# Patient Record
Sex: Male | Born: 1935 | Race: White | Hispanic: No | Marital: Married | Smoking: Former smoker
Health system: Southern US, Community
[De-identification: ages and names within clinical notes are randomized; demographics above are authoritative.]

## PROBLEM LIST (undated history)

## (undated) DIAGNOSIS — I2510 Atherosclerotic heart disease of native coronary artery without angina pectoris: Secondary | ICD-10-CM

## (undated) DIAGNOSIS — I739 Peripheral vascular disease, unspecified: Secondary | ICD-10-CM

## (undated) DIAGNOSIS — I779 Disorder of arteries and arterioles, unspecified: Secondary | ICD-10-CM

## (undated) DIAGNOSIS — I255 Ischemic cardiomyopathy: Secondary | ICD-10-CM

## (undated) DIAGNOSIS — I10 Essential (primary) hypertension: Secondary | ICD-10-CM

## (undated) DIAGNOSIS — I219 Acute myocardial infarction, unspecified: Secondary | ICD-10-CM

## (undated) DIAGNOSIS — E782 Mixed hyperlipidemia: Secondary | ICD-10-CM

## (undated) DIAGNOSIS — C801 Malignant (primary) neoplasm, unspecified: Secondary | ICD-10-CM

## (undated) HISTORY — DX: Mixed hyperlipidemia: E78.2

## (undated) HISTORY — PX: FEMORAL HERNIA REPAIR: SHX632

## (undated) HISTORY — DX: Atherosclerotic heart disease of native coronary artery without angina pectoris: I25.10

## (undated) HISTORY — DX: Acute myocardial infarction, unspecified: I21.9

## (undated) HISTORY — DX: Ischemic cardiomyopathy: I25.5

## (undated) HISTORY — DX: Peripheral vascular disease, unspecified: I73.9

## (undated) HISTORY — DX: Essential (primary) hypertension: I10

## (undated) HISTORY — DX: Disorder of arteries and arterioles, unspecified: I77.9

---

## 2003-07-29 HISTORY — PX: CORONARY ARTERY BYPASS GRAFT: SHX141

## 2003-07-30 ENCOUNTER — Inpatient Hospital Stay (HOSPITAL_COMMUNITY): Admission: AD | Admit: 2003-07-30 | Discharge: 2003-08-06 | Payer: Self-pay | Admitting: *Deleted

## 2003-12-08 ENCOUNTER — Encounter
Admission: RE | Admit: 2003-12-08 | Discharge: 2003-12-08 | Payer: Self-pay | Admitting: Thoracic Surgery (Cardiothoracic Vascular Surgery)

## 2004-04-29 ENCOUNTER — Ambulatory Visit: Payer: Self-pay | Admitting: Cardiology

## 2005-04-18 ENCOUNTER — Ambulatory Visit: Payer: Self-pay | Admitting: Cardiology

## 2005-12-02 ENCOUNTER — Ambulatory Visit: Payer: Self-pay | Admitting: Cardiology

## 2005-12-09 ENCOUNTER — Ambulatory Visit: Payer: Self-pay | Admitting: Cardiology

## 2005-12-15 ENCOUNTER — Ambulatory Visit: Payer: Self-pay | Admitting: Cardiology

## 2005-12-30 ENCOUNTER — Ambulatory Visit: Payer: Self-pay | Admitting: Cardiology

## 2006-08-07 ENCOUNTER — Ambulatory Visit: Payer: Self-pay | Admitting: Cardiology

## 2006-08-16 ENCOUNTER — Ambulatory Visit: Payer: Self-pay | Admitting: Cardiology

## 2007-04-17 ENCOUNTER — Encounter: Payer: Self-pay | Admitting: Cardiology

## 2007-04-17 ENCOUNTER — Ambulatory Visit: Payer: Self-pay | Admitting: Cardiology

## 2007-10-19 ENCOUNTER — Ambulatory Visit: Payer: Self-pay | Admitting: Cardiology

## 2007-10-24 ENCOUNTER — Encounter: Payer: Self-pay | Admitting: Cardiology

## 2008-09-16 ENCOUNTER — Encounter: Payer: Self-pay | Admitting: Cardiology

## 2008-10-16 ENCOUNTER — Encounter: Payer: Self-pay | Admitting: Cardiology

## 2008-12-11 ENCOUNTER — Ambulatory Visit: Payer: Self-pay | Admitting: Cardiology

## 2008-12-12 ENCOUNTER — Encounter: Payer: Self-pay | Admitting: Cardiology

## 2008-12-19 ENCOUNTER — Encounter: Payer: Self-pay | Admitting: Cardiology

## 2009-02-13 ENCOUNTER — Encounter: Payer: Self-pay | Admitting: Cardiology

## 2009-02-17 ENCOUNTER — Encounter: Payer: Self-pay | Admitting: Cardiology

## 2009-11-23 ENCOUNTER — Telehealth (INDEPENDENT_AMBULATORY_CARE_PROVIDER_SITE_OTHER): Payer: Self-pay | Admitting: *Deleted

## 2009-12-10 ENCOUNTER — Ambulatory Visit: Payer: Self-pay | Admitting: Cardiology

## 2009-12-10 DIAGNOSIS — I2510 Atherosclerotic heart disease of native coronary artery without angina pectoris: Secondary | ICD-10-CM

## 2009-12-10 DIAGNOSIS — E782 Mixed hyperlipidemia: Secondary | ICD-10-CM | POA: Insufficient documentation

## 2009-12-10 DIAGNOSIS — I6529 Occlusion and stenosis of unspecified carotid artery: Secondary | ICD-10-CM | POA: Insufficient documentation

## 2009-12-10 DIAGNOSIS — I10 Essential (primary) hypertension: Secondary | ICD-10-CM

## 2010-02-23 ENCOUNTER — Telehealth (INDEPENDENT_AMBULATORY_CARE_PROVIDER_SITE_OTHER): Payer: Self-pay | Admitting: *Deleted

## 2010-06-20 ENCOUNTER — Encounter: Payer: Self-pay | Admitting: Thoracic Surgery (Cardiothoracic Vascular Surgery)

## 2010-06-29 NOTE — Progress Notes (Signed)
Summary: RX REFILL METOPROLOL SUCCINATE  Phone Note Call from Patient Call back at Home Phone [phone]   Caller: patient walk in Reason for Call: Refill Medication Summary of Call: Metoprolol Succ   25 mg 1 tab daily [facility] Initial call taken by: Claudette Laws,  November 23, 2009 10:06 AM    Prescriptions: METOPROLOL SUCCINATE 25 MG XR24H-TAB (METOPROLOL SUCCINATE) Take 1 tablet by mouth once  a day  #90 x 0   Entered by:   Carlye Grippe   Authorized by:   Loreli Slot, MD, [facility]   Signed by:   Carlye Grippe on 11/23/2009   Method used:   Electronically to        [facility]* (retail)             ,          Ph: [phone]       Fax: [phone]   RxID:   [id number]

## 2010-06-29 NOTE — Progress Notes (Signed)
Summary: REFILL REQUEST/RX REFILL TOPROL XL  Phone Note Call from Patient Call back at Home Phone [phone]   Caller: PATIENT WALK IN Reason for Call: Talk to Nurse Details for Reason: refill medication Summary of Call: metoprolo succ(toprol-er   25 MG TAKE 1 TABLET DAILY  [facility] Initial call taken by: Claudette Laws,  February 23, 2010 10:11 AM    Prescriptions: METOPROLOL SUCCINATE 25 MG XR24H-TAB (METOPROLOL SUCCINATE) Take 1 tablet by mouth once  a day  #90 x 3   Entered by:   Carlye Grippe   Authorized by:   Loreli Slot, MD, [facility]   Signed by:   Carlye Grippe on 02/23/2010   Method used:   Electronically to        [facility]* (retail)             ,          Ph: [phone]       Fax: [phone]   RxID:   [id number]

## 2010-06-29 NOTE — Assessment & Plan Note (Signed)
Summary: 1 yr check -rev reminder vs   Visit Type:  Follow-up Primary Provider:  Dr. Doreen Beam   History of Present Illness: 77 year old male presents for followup. He was last seen in July 2010. He continues to do quite well. He denies any problems with angina or limiting shortness of breath. He continues to stay active doing outdoor chores in the mornings. She reports compliance with his medications which are outlined below.  Labs from last July showed AST 23, ALT 17, cholesterol 98, HDL 28, LDL 49, triglycerides 106. We discussed these today.  I reviewed his history and prior cardiac testing. He has previously documented mild carotid atherosclerosis. He prefers observation only at this time.  Preventive Screening-Counseling & Management  Alcohol-Tobacco     Smoking Status: quit     Year Quit: 1980  Current Medications (verified): 1)  Altace 10 Mg Caps (Ramipril) .Marland Kitchen.. 1 Capsule By Mouth Once A Day 2)  Hydrochlorothiazide 25 Mg Tabs (Hydrochlorothiazide) .... Take One Half Tablet By Mouth Once A Day 3)  Metoprolol Succinate 25 Mg Xr24h-Tab (Metoprolol Succinate) .... Take 1 Tablet By Mouth Once  A Day 4)  Simvastatin 10 Mg Tabs (Simvastatin) .... Take 1 /2 Tablet By Mouth Every Night. 5)  Centrum Silver Ultra Mens  Tabs (Multiple Vitamins-Minerals) .... Take 1 Tablet By Mouth Once A Day 6)  Aspirin Ec 325 Mg Tbec (Aspirin) .... Take One Half Tablet By Mouth Daily  Allergies (verified): No Known Drug Allergies  Comments:  Nurse/Medical Assistant: The patient's medication list and allergies were reviewed with the patient and were updated in the Medication and Allergy Lists.  Past History:  Social History: Last updated: 12/10/2009 Married  Tobacco Use - No Alcohol Use - no Drug Use - no  Past Medical History: CAD - multivessel Hyperlipidemia Hypertension PVD Myocardial Infarction - remote NSTEMI Carotid artery disease - mild  Past Surgical  History: Herniorrhaphy CABG 3/05, Dr. Cornelius Moras, LIMA to LAD, SVG to diagonal and OM, SVG to PDA  Family History: Family History of Coronary Artery Disease  Social History: Married  Tobacco Use - No Alcohol Use - no Drug Use - no Smoking Status:  quit  Review of Systems  The patient denies anorexia, fever, chest pain, syncope, dyspnea on exertion, peripheral edema, melena, and hematochezia.         Otherwise reviewed and negative.  Vital Signs:  Patient profile:   77 year old male Height:      69 inches Weight:      176 pounds BMI:     26.08 Pulse rate:   60 / minute BP sitting:   136 / 90  (left arm) Cuff size:   regular  Vitals Entered By: Carlye Grippe (December 10, 2009 2:13 PM)  Physical Exam  Additional Exam:  Normally nourished elderly male in no acute distress. HEENT: Conjunctiva and lids normal, oropharynx with moist mucosa. Neck: Supple, no elevated JVP or bruits. Lungs: Clear to auscultation, no murmur. Cardiac: Regular rate and rhythm, soft systolic murmur at the base, preserved second heart sound, no S3 Abdomen: Soft, nontender, bowel sounds present. Extremities: No pitting edema.   Nuclear Study  Procedure date:  08/16/2006  Findings:      Adenosine Cardiolite without diagnostic ST segment changes. Anteroseptal and basal inferior fixed defects, no ischemia, LVEF 60%.  EKG  Procedure date:  12/10/2009  Findings:      Sinus bradycardia at 58 beats per minute with prolonged PR interval of 256 ms and occasional  PAC, left axis, nonspecific ST changes.  Impression & Recommendations:  Problem # 1:  CORONARY ATHEROSCLEROSIS NATIVE CORONARY ARTERY (ICD-414.01)  Symptomatically stable on medical therapy. No refills needed today. Mr. Huesman remains pleased with his functional capacity, and in light of no new symptomatology, we will hold off on further ischemic evaluation at this time. Most recent Cardiolite is noted above. Continue annual followup.  His  updated medication list for this problem includes:    Altace 10 Mg Caps (Ramipril) .Marland Kitchen... 1 capsule by mouth once a day    Metoprolol Succinate 25 Mg Xr24h-tab (Metoprolol succinate) .Marland Kitchen... Take 1 tablet by mouth once  a day    Aspirin Ec 325 Mg Tbec (Aspirin) .Marland Kitchen... Take one half tablet by mouth daily  Problem # 2:  ESSENTIAL HYPERTENSION, BENIGN (ICD-401.1)  Continue medical therapy. Followed by Dr. Sherril Croon.  His updated medication list for this problem includes:    Altace 10 Mg Caps (Ramipril) .Marland Kitchen... 1 capsule by mouth once a day    Hydrochlorothiazide 25 Mg Tabs (Hydrochlorothiazide) .Marland Kitchen... Take one half tablet by mouth once a day    Metoprolol Succinate 25 Mg Xr24h-tab (Metoprolol succinate) .Marland Kitchen... Take 1 tablet by mouth once  a day    Aspirin Ec 325 Mg Tbec (Aspirin) .Marland Kitchen... Take one half tablet by mouth daily  Problem # 3:  MIXED HYPERLIPIDEMIA (ICD-272.2)  Continuing to tolerate low-dose simvastatin. Lipids are aggressively controlled.  His updated medication list for this problem includes:    Simvastatin 10 Mg Tabs (Simvastatin) .Marland Kitchen... Take 1 /2 tablet by mouth every night.  Problem # 4:  CAROTID ARTERY OCCLUSION (ICD-433.10)  Mild based on prior evaluation. Patient prefers observation only at this time.  His updated medication list for this problem includes:    Aspirin Ec 325 Mg Tbec (Aspirin) .Marland Kitchen... Take one half tablet by mouth daily  Other Orders: EKG w/ Interpretation (93000)  Patient Instructions: 1)  Your physician wants you to follow-up in: 1 year. You will receive a reminder letter in the mail one-two months in advance. If you don't receive a letter, please call our office to schedule the follow-up appointment. 2)  Your physician recommends that you go to the [facility] for a FASTING lipid profile and liver function labs:  BEFORE OFFICE VISIT IN 1 YEAR.

## 2010-10-12 NOTE — Assessment & Plan Note (Signed)
[facility]                          [facility] OFFICE NOTE   NAME:[name], [name]                     MRN:          [id number]  DATE:04/17/2007                            DOB:          June 15, 1917    CARDIOLOGIST:  Dr. Simona Huh.   PRIMARY CARE PHYSICIAN:  Dr. Doreen Beam.   REASON FOR VISIT:  Six-month followup.   HISTORY OF PRESENT ILLNESS:  [name] is a very pleasant 77 year old  male patient, a World War II veteran who fought with the [employer] in [place], who presents to the office today for followup.  Overall, [name] is doing well.  [name] denies any chest pain or shortness of  breath.  [name] denies any exertional heaviness or tightness.  [name] denies any  significant exertional dyspnea.  [name] denies any orthopnea, PND or pedal  edema.  [name] denies any syncope or near-syncope.  [name] denies any  lightheadedness or dizziness.   CURRENT MEDICATIONS:  1. Aspirin 325 mg half a tablet daily.  2. Toprol-XL 25 mg daily.  3. Simvastatin 10 mg nightly.  4. Altace 10 mg daily.  5. Hydrochlorothiazide 25 mg half a tablet daily.   PHYSICAL EXAMINATION:  [name] is a well-nourished, well-developed male.  Blood pressure is 136/82, pulse 67, weight 176 pounds.  HEENT:  Normal.  NECK:  Without JVD.  CARDIAC:  S1 and S2, regular rate and rhythm without murmurs.  LUNGS:  Clear to auscultation bilaterally.  ABDOMEN:  Soft and nontender.  EXTREMITIES:  Without edema.  Calves are soft and nontender.  SKIN:  Warm and dry.  NEUROLOGIC:  [name] is alert and oriented x3.  Cranial nerves II-XII are  grossly intact.   EKG:  Reveals sinus rhythm with a heart rate of 56, left axis deviation,  inferior Q waves, PVC with compensatory pause, no acute changes.   DATA BASE:  Laboratory work from August 16, 2006:  BUN 19, creatinine  1.3, triglycerides 116, total cholesterol 120, HDL 31, LDL 66, potassium  3.7.   IMPRESSION:  1. Coronary artery disease.      a.      Status post non-ST-elevation myocardial infarction requiring       emergent four-vessel coronary artery bypass graft secondary to       critical left main disease, March 2005.  2. Ischemic cardiomyopathy with ejection fraction of 40% improved to      60% by echocardiogram, July 2007.  3. Chronic first-degree atrioventricular block.  4. Sinus bradycardia.      a.     Asymptomatic.  5. Peripheral arterial disease.      a.     Mild infrarenal aneurysmal dilatation.      b.     Nonobstructive bilateral renal artery stenosis.      c.     Sixty percent right common iliac artery stenosis.  6. Chronic obstructive pulmonary disease/interstitial lung disease.  7. 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  8. Chest x-ray done March 2008 revealed hyperinflation consistent with      chronic obstructive pulmonary disease with areas of fibrotic  change, stable appearance of chronic pulmonary changes.  9. Hypertension.  10.Hyperlipidemia.  11.History of mild lower extremity edema.   PLAN:  The patient presents to the office today for followup.  Overall,  [name] is doing well without ischemic symptoms.  At this point in time, we  plan to:  1. Repeat a chest x-ray to ensure stability.  2. Follow up in 6 months with Dr. Diona Browner or sooner p.r.n.      Tereso Newcomer, PA-C  Electronically Signed      Learta Codding, MD,FACC  Electronically Signed   SW/MedQ  DD: 04/17/2007  DT: 04/18/2007  Job #: [id number]   cc:   Doreen Beam

## 2010-10-12 NOTE — Assessment & Plan Note (Signed)
**[name] De-Identified via Obfuscation** [facility]                          Jordan [name]   NAME:[name], Jordan ANTOSH                     MRN:          [id number]  DATE:10/19/2007                            DOB:          01/08/1918    PRIMARY CARE PHYSICIAN:  Dr. Doreen Beam.   REASON FOR VISIT:  Routine cardiac followup.   HISTORY OF PRESENT ILLNESS:  Jordan [name] comes in for a 70-month visit.  He is doing very well.  He states that he was up early this morning  working in his garden and plans to harvest some hay this afternoon.  He  is not reporting any angina, cough, or limiting breathlessness.  He  states that he works and when he needs to rest, he sits down and  otherwise does not overexert.  Blood pressure and heart rate look quite  good today.  He needs a refill for Zocor.  I [name] that he has not had  lipids or liver function since last year, and he had a followup chest x-  ray obtained after his last visit which demonstrated hyperinflation  consistent with obstructive pulmonary disease and also some vague  nodularity within the lungs that appeared to be stable without any  obvious acute process.  No discrete mass was described.   ALLERGIES:  No known drug allergies.   PRESENT MEDICATIONS:  1. Aspirin 325 mg 1/2 tablet p.o. daily.  2. Toprol XL 25 mg p.o. daily.  3. Zocor 10 mg p.o. q.h.s.  4. Altace 10 mg p.o. daily.  5. Hydrochlorothiazide 12.5 mg p.o. daily.   REVIEW OF SYSTEMS:  As described in history of present illness,  otherwise negative.   PHYSICAL EXAMINATION:  Blood pressure is 123/72, heart rate is 60.  The patient is comfortable and in no acute distress.  HEENT:  Conjunctivae is normal.  Pharynx is clear.  NECK:  Supple.  No elevated jugular venous pressure, no loud bruits.  No  thyromegaly is noted.  LUNGS:  Clear, somewhat coarse breath sounds, nonlabored breathing at  rest.  CARDIAC:  Regular rate and rhythm.  No pathologic murmur or S3 gallop.  ABDOMEN:  Soft, nontender.  EXTREMITIES:  Exhibit no frank pitting edema.  Distal pulses are 2+.  SKIN:  Warm and dry.  MUSCULOSKELETAL:  No kyphosis is noted.  NEUROPSYCHIATRIC:  The patient is alert and oriented x3.  Affect is  normal.   IMPRESSION AND RECOMMENDATIONS:  1. Coronary artery disease status post previous non-ST elevation      myocardial infarction with coronary bypass grafting in March 2005      and overall ejection fraction of 60% by echocardiography in 2007.      He is quite stable symptomatically on medical therapy, and we will      continue observation at this point.  2. History of chronic obstructive pulmonary disease/interstitial lung      disease.  Chest x-ray was stable following his last visit.  3. Hypertension, well-controlled.  4. Hyperlipidemia, on statin therapy.  We will plan a followup fasting      profile, liver function tests for surveillance.  5. History of peripheral arterial disease without frank claudication.     Jordan Sidle, MD  Electronically Signed    SGM/MedQ  DD: 10/19/2007  DT: 10/19/2007  Job #: [id number]   cc:   Doreen Beam, MD

## 2010-10-12 NOTE — Assessment & Plan Note (Signed)
[facility]                          [facility] OFFICE NOTE   NAME:[name], [name]                     MRN:          [id number]  DATE:12/11/2008                            DOB:          [birth year]    PRIMARY CARE PHYSICIAN:  Doreen Beam, MD   REASON FOR VISIT:  Routine cardiac followup.   HISTORY OF PRESENT ILLNESS:  [name] comes in for an annual visit.  He is doing quite well without any reported angina or limiting  breathlessness.  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  He continues on  medical therapy as outlined below and is due for followup lipid profile  and liver function tests.  Electrocardiogram today shows sinus rhythm  with a prolonged PR interval of 232 msec and otherwise nonspecific ST-T  wave changes which are old.  He has undergone an ischemic evaluation  within the last 2 years and is not reporting any progressive symptoms.   ALLERGIES:  No known drug allergies.   PRESENT MEDICATIONS:  1. Aspirin 325 mg 1-1/2 tablet p.o. daily.  2. Toprol-XL 25 mg p.o. daily.  3. Simvastatin 10 mg p.o. nightly.  4. Altace 10 mg p.o. daily.  5. Hydrochlorothiazide 12.5 mg p.o. daily.  6. Multivitamin once daily.   REVIEW OF SYSTEMS:  Outlined above.  No claudication, palpitations, or  syncope.  Otherwise, reviewed and negative.   PHYSICAL EXAMINATION:  VITAL SIGNS:  Blood pressure is 125/80 on the  right, heart rate 61, weight 173 pounds.  GENERAL:  The patient is comfortable, well-nourished elderly male, in no  acute distress.  HEENT:  Conjunctiva is normal.  Oropharynx clear.  NECK:  Supple.  No elevated jugular venous pressure or loud carotid  bruits.  No thyromegaly.  LUNGS:  Clear with coarse breath sounds, but nonlabored breathing.  CARDIOVASCULAR:  Regular rate and rhythm.  No loud murmur, S3 gallop.  ABDOMEN:  Soft, nontender.  EXTREMITIES:  Exhibit no  pitting edema.  Distal pulses are 2+.  SKIN:  Warm and dry.  MUSCULOSKELETAL:  No kyphosis noted.  NEUROPSYCHIATRIC:  The patient is alert and oriented x3.  Affect is  normal.   IMPRESSION AND RECOMMENDATIONS:  1. Cardiovascular disease, status post remote non-ST-elevation      myocardial infarction with subsequent coronary artery bypass      grafting in 2005.  Ischemic evaluation in 2008 was reassuring and      he is not having any progressive symptoms on medical therapy.  We      will continue a plan for annual followup, sooner if he has      progressive symptoms.  2. Hyperlipidemia, on statin therapy.  We will follow up with a      fasting lipid profile and liver function test.  Goal LDL should be      around 70.  3. Hypertension, well controlled today.  4. Peripheral arterial disease including nonobstructive carotid artery      disease.  We will consider a repeat carotid duplex around the time  of his next visit if not done sooner by Dr. Sherril Croon.     Jonelle Sidle, MD  Electronically Signed    SGM/MedQ  DD: 12/11/2008  DT: 12/12/2008  Job #: [id number]   cc:   Doreen Beam, MD

## 2010-10-15 NOTE — Consult Note (Signed)
[name], [name]                        ACCOUNT NO.:  [id number]   MEDICAL RECORD NO.:  [id number]                   PATIENT TYPE:  INP   LOCATION:  2860                                 FACILITY:  [facility]   PHYSICIAN:  Salvatore Decent. Cornelius Moras, M.D.              DATE OF BIRTH:  1918/03/31   DATE OF CONSULTATION:  07/30/2003  DATE OF DISCHARGE:                                   CONSULTATION   REQUESTING PHYSICIAN:  Veneda Melter, M.D.   REASON FOR CONSULTATION:  Critical left main disease, three-vessel coronary  artery disease, status post acute non-Q-wave myocardial infarction.   HISTORY OF PRESENT ILLNESS:  Mr. [name] is an 77 year old retired white  male who lives near [place] and is followed by Dr. Doreen Beam with  history of coronary artery disease and hypertension.  The patient reportedly  has a history of acute myocardial infarction in 1981.  He has been doing  well with long-term medical therapy since then.  Approximately 2 weeks ago  he developed new-onset substernal chest pain occurring with exertion.  The  pain relieved.  Approximately 11 p.m. last night the patient developed  similar but more severe substernal chest pain radiating across his chest  associated with belching and indigestion.  His pain persisted despite trying  oral antacid therapy, prompting him to present to the emergency room in the  early morning hours of July 30, 2003 at [facility].  There,  by the time he had arrived, his pain had resolved.  Initial cardiac enzymes  were positive for an acute non-Q-wave myocardial infarction with a total CK  of 67 with a CK-MB fraction of 4.8 and a troponin I of 0.22.  Baseline  electrocardiogram demonstrated some ST segment depression in the  anterolateral leads and T wave inversion in aVL.  He remained clinically  stable and was promptly transferred to [facility] for further management and  therapy.  Upon arrival the patient was brought directly to the  cardiac  catheterization laboratory where he has undergone cardiac catheterization by  Dr. Chales Abrahams.  This demonstrates critical left main coronary stenosis as well  as severe three-vessel coronary artery disease, mild to moderate left  ventricular dysfunction, and mild to moderate mitral regurgitation.  Left  ventricular end diastolic pressure is somewhat elevated at 30 mmHg.  Emergency cardiac surgical consultation was requested.   REVIEW OF SYMPTOMS:  The patient remains in the cardiac catheterization  laboratory at this time and is comfortable and denies any ongoing chest pain  or shortness of breath.  He reports that otherwise he has been feeling well  recently.  He has good appetite and has not been gaining or losing weight.  CARDIAC:  The patient describes symptoms of chest pain as noted previously.  He denies any problems with shortness of breath either with exertion or at  rest.  He denies history of PND, orthopnea, lower extremity edema,  palpitations, syncope.  RESPIRATORY:  The patient does report a dry cough  occasionally productive of some clear mucous recently.  The patient denies  hemoptysis or wheezing.  INFECTIOUS:  Negative.  The patient denies recent  fevers or chills.  GASTROINTESTINAL:  Negative.  The patient reports good  appetite.  He has no difficulty swallowing.  He reports normal bowel  function and denies hematochezia, hematemesis, or melena.  MUSCULOSKELETAL:  Negative.  The patient denies problems with arthritis.  He has no difficulty  walking.  NEUROLOGIC:  Negative.  The patient denies symptoms suggestive of  previous TIA or stroke.  ENDOCRINE:  Negative.  HEENT:  Negative.  The  patient denies loose teeth, problems with the teeth, or changes in his  eyesight recently.  PSYCHIATRIC:  Negative.   PAST MEDICAL HISTORY:  1. Coronary artery disease status post acute myocardial infarction in 1981.     The patient has been treated medically.  2. Hypertension.    PAST SURGICAL HISTORY:  Status post left inguinal hernia repair.   SOCIAL HISTORY:  The patient is a retired Psychologist, occupational and lives with his wife  somewhere between [place] and [place].  He remains quite active physically  for his age.  He smokes a pipe.  He denies any history of significant  alcohol consumption.  He has no children.   FAMILY HISTORY:  Notable for the absence of premature coronary artery  disease.  He has one brother with lung cancer and another brother with  Alzheimer's dementia.   MEDICATIONS PRIOR TO ADMISSION:  Include aspirin.   PHYSICAL EXAMINATION:  GENERAL:  The patient is an elderly white male who  appears his stated age in no acute distress.  VITAL SIGNS:  He is currently in sinus rhythm with blood pressure 136/79.  HEENT:  Grossly unrevealing.  NECK:  Supple.  No carotid bruits are noted.  There is no jugular venous  distention.  CHEST:  Auscultation of the chest demonstrates clear and symmetrical breath  sounds bilaterally.  No wheezes or rhonchi are demonstrated.  CARDIOVASCULAR:  Includes regular rate and rhythm.  No murmurs are noted at  present.  ABDOMEN:  Soft, nontender.  Bowel sounds are present.  EXTREMITIES:  Warm and well perfused.  The femoral sheath has just been  removed from the right groin.  Distal pulses are not palpable in either  lower leg at the ankle.  There is no sign of significant venous  insufficiency.  RECTAL AND GENITOURINARY:  Both deferred.  NEUROLOGIC:  Grossly nonfocal.   DIAGNOSTIC TESTS:  Cardiac catheterization performed by Dr. Chales Abrahams is  reviewed.  This demonstrates critical 95% stenosis of the left main coronary  artery.  There is 100% proximal occlusion of the left anterior descending  coronary artery after takeoff of a large first diagonal branch.  There is 70-  80% proximal stenosis of a small first circumflex marginal branch and 60-70% stenosis of the left circumflex before a large second circumflex marginal  branch.   There is 70% proximal and 70% distal stenosis of the right coronary  artery.  There is moderate left ventricular dysfunction with akinesis of the  distal anterior wall and mild to moderate global hypokinesis.  Overall  ejection fraction is estimated 40%.  There is at least mild to moderate  (1+/2+) mitral regurgitation.  Left ventricular end diastolic pressure is  moderately elevated at 30 mmHg.   Laboratory data obtained a [facility] prior to transfer included  complete blood count with  white blood count 14,400; hemoglobin 15.9;  hematocrit 48%; platelet count 324,000.  Coagulation profile was normal at  the time of presentation and baseline serum creatinine was 1.0.   IMPRESSION:  Critical left main disease, three-vessel coronary artery  disease, mild to moderate mitral regurgitation, status post acute non-Q-wave  myocardial infarction.  I believe that Mr. Callow would best be treated by  emergent surgical revascularization.  We will plan intraoperative  transesophageal echocardiogram to further assess the significance of the  mitral regurgitation.   PLAN:  I have discussed issues here in the catheterization laboratory with  Mr. Reddy.  All of his questions have been addressed.  He understands and  accepts all associated risks of surgery including but not limited to risks  of  death, stroke, myocardial infarction, congestive heart failure, respiratory  failure, pneumonia, bleeding requiring blood transfusion, arrhythmia,  infection, and recurrent coronary artery disease.  All his questions have  been addressed.                                               Salvatore Decent. Cornelius Moras, M.D.    CHO/MEDQ  D:  07/30/2003  T:  07/30/2003  Job:  [id number]   cc:   Doreen Beam  [address]  [place]  [place] [address]  Fax: [phone]   [facility]

## 2010-10-15 NOTE — Op Note (Signed)
[name], [name]                        ACCOUNT NO.:  [id number]   MEDICAL RECORD NO.:  [id number]                   PATIENT TYPE:  INP   LOCATION:  2314                                 FACILITY:  [facility]   PHYSICIAN:  Salvatore Decent. Cornelius Moras, M.D.              DATE OF BIRTH:  1917-06-29   DATE OF PROCEDURE:  07/30/2003  DATE OF DISCHARGE:                                 OPERATIVE REPORT   PREOPERATIVE DIAGNOSIS:  Critical left main disease, three-vessel coronary  artery disease, status post acute non-Q wave myocardial infarction.   POSTOPERATIVE DIAGNOSIS:  Critical left main disease, three-vessel coronary  artery disease, status post acute non-Q wave myocardial infarction.   OPERATION PERFORMED:  Emergency median sternotomy for coronary artery bypass  grafting times four (left internal mammary artery to distal left anterior  descending coronary artery, saphenous vein graft to first diagonal branch,  saphenous vein graft to the second circumflex marginal, branch, saphenous  vein graft to posterior descending coronary artery, endoscopic saphenous  vein harvest from right thigh).   SURGEON:  Salvatore Decent. Cornelius Moras, M.D.   ASSISTANT:  Salvatore Decent. Dorris Fetch, M.D.   SECOND ASSISTANT:  Coral Ceo, P.A.   ANESTHESIA:  General.   INDICATIONS FOR PROCEDURE:  The patient is an 77 year old male with history  of coronary artery disease who presents with an acute non-Q wave myocardial  infarction in the early morning hours of July 30, 2003.  He initially  presented to [facility] and was transferred to [facility]  where he underwent emergency cardiac catheterization by Veneda Melter, M.D.  This demonstrated critical 95% left main coronary stenosis with severe three-  vessel coronary artery disease.  A full consultation note has been dictated  previously.  The patient and his family have been counseled at length  regarding the indications and potential benefits of surgery as well  as the  urgent need for intervention.  All of their questions have been addressed.  The patient understands and accepts all associated risks of surgery and  desires to proceed as described.   DESCRIPTION OF PROCEDURE:  The patient was brought directly from the cardiac  catheterization lab to the operating room on the afternoon of July 30, 2003  and placed in supine position on the operating table.  Central monitoring  was established by the anesthesia service under the care and direction of  Kaylyn Layer. Michelle Piper, M.D.  Specifically, a Swann-Ganz catheter was placed through  the right internal jugular approach.  A radial arterial line was placed.  Intravenous antibiotics were administered.  Following induction with general  endotracheal anesthesia, a Foley catheter was placed.  The patient's chest,  abdomen, both groins, and both lower extremities were prepped and draped in  sterile manner.  Baseline transesophageal echocardiogram was performed by  Dr. Michelle Piper.  This demonstrated mild left ventricular dysfunction with mild  global hypokinesis and mild hypokinesis of the digital anterior wall.  There  is mild (1+) mitral regurgitation.  This consists of a series of very small  central jets of mitral regurgitation.  These are all narrow jets and very  small and do not cross half way across the left atrium at all.  This is not  felt to be clinically significant and the pulmonary artery pressures are  notably low.  even with some manipulation, the patient's blood pressure  using Neo-Synephrine and elevation of preload, the severity of mitral  regurgitation does not increase.   A median sternotomy incision was performed and the left internal mammary  artery was dissected from the chest wall and prepared for bypass grafting.  The left internal mammary artery was good quality conduit.  Simultaneously  saphenous vein was obtained from the patient's right thigh and the upper  portion of the right lower  leg using endoscopic vein harvest technique. The  saphenous vein was good quality conduit.  The patient was heparinized  systemically.   The pericardium was opened.  The ascending aorta was mildly dilated and  moderately sclerotic.  The ascending aorta was cannulated for  cardiopulmonary bypass.  A retrograde cardioplegia catheter was placed  through the right atrium into the coronary sinus.  The two stage venous  cannula was placed through the tip of the right atrial appendage.  Adequate  heparinization was verified.   Cardiopulmonary bypass was begun and the surface of the heart was inspected.  Distal sites were selected for coronary bypass grafting.  Portions of  saphenous vein and the left internal mammary artery were trimmed to  appropriate length. A temperature probe was placed in the left ventricular  septum.  A cardioplegia catheter was placed in the ascending aorta.   The patient was cooled to 32 degrees systemic temperature.  The aortic cross-  clamp was applied and cardioplegia was delivered initially in an antegrade  fashion through the aortic root.  Iced saline slush was applied for topical  hypothermia.  Supplemental cardioplegia was administered retrograde through  the coronary sinus catheter.  The initial cardioplegic arrest and myocardial  cooling were felt to be excellent.  Repeat doses of cardioplegia were  administered intermittently throughout the cross-clamp portion of the  operation, antegrade through subsequently placed vein grafts and retrograde  through the coronary sinus catheters to maintain septal temperature below 15  degrees centigrade.  The following digital coronary anastomoses were  performed:  (1)  The posterior descending coronary artery was grafted with a  saphenous vein graft in an end-to-side fashion.  This coronary measured 2.2  mm in diameter and was of good quality.  (2)  The second circumflex marginal branch was grafted with a saphenous vein  graft in an end-to-side fashion.  This coronary measured 2.0 mm in diameter and was of good quality.  (3)  The  first diagonal branch of the left anterior descending coronary artery was  grafted with a saphenous vein graft in end-to-side fashion.  This coronary  measured 1.5 mm in diameter and was of good quality.  (4)  The distal left  anterior descending coronary artery was grafted with the left internal  mammary artery in end-to-side fashion.  This coronary measured 1.5 mm in  diameter and was of fair quality.  It was chronically occluded proximally.   All three proximal saphenous vein anastomoses were performed directly to the  ascending aorta prior to removal of the aortic cross-clamp.  Septal  temperature was noted to rise rapidly upon reperfusion of the left  internal  mammary artery.  One final dose of warm retrograde hot shot cardioplegia was  administered.  All air was evacuated from the aortic root.  The aortic cross-  clamp was removed after a total cross-clamp of 83 minutes.   The heart began to beat spontaneously without need for cardioversion.  All  proximal and distal coronary anastomoses were inspected for hemostasis and  appropriate graft orientation.  Epicardial pacing wires were fixed to the  right ventricle and to the right atrial appendage.  The patient was rewarmed  to 37 degrees centigrade temperature.  The patient was weaned from  cardiopulmonary bypass without difficulty.  The patient's rhythm at  separation from bypass was normal sinus rhythm.  The patient was weaned from  bypass on low dose dopamine at 3 mcg per kg per minute.  Total  cardiopulmonary bypass time for the operation was 105 minutes.   Follow-up transesophageal echocardiogram performed by Dr. Sampson Goon  demonstrates improved left ventricular function with essentially normal left  ventricular function and trivial mitral regurgitation.  No other  abnormalities were noted.  Venous and arterial  cannulae were removed  uneventfully.  Protamine was administered to reverse the anticoagulation.  The mediastinum and the left chest were irrigated with saline solution  containing vancomycin.  Meticulous surgical hemostasis was ascertained.  The  mediastinum and the left chest were drained with three chest tubes placed  through separate stab incisions inferiorly.  The median sternotomy was  closed in routine fashion.  The right lower extremity incisions were all  closed in multiple layers in routine fashion.  All skin incisions were  closed with subcuticular skin closure.   The patient tolerated the procedure well.  Sponge count was incorrect at  completion of the procedure, so a portable chest x-ray was performed on the  operating room.  No foreign bodies were identified although a small  pneumothorax on the right side was identified.  The right chest tube was  placed after preparing the patient's right lateral chest wall with Betadine solution and draping in sterile manner.  A 28 French chest tube was placed  through a stab incision and secured to the skin uneventfully.   The patient tolerated the procedure well and was transported to the surgical  intensive care unit in stable condition. There were no intraoperative  complications.  No blood products were administered.                                               Salvatore Decent. Cornelius Moras, M.D.    CHO/MEDQ  D:  07/30/2003  T:  07/31/2003  Job:  [id number]   cc:   Veneda Melter, M.D.   Doreen Beam  [address]  [place]  [place] [address]  Fax: [phone]

## 2010-10-15 NOTE — Assessment & Plan Note (Signed)
[facility]                          [facility] OFFICE NOTE   [name], [name]                     MRN:          [id number]  DATE:08/07/2006                            DOB:          03-May-1918    PRIMARY CARDIOLOGIST:  Jonelle Sidle, MD   REASON FOR VISIT:  Six-month follow-up.   Since last seen here in the clinic by me in July 2007, the patient  continues to do extremely well from a clinical standpoint with no  interim development of signs or symptoms suggestive of unstable angina  pectoris.  He remains quite active both in and out of the home and even  does strenuous work with use of a hand tiller for his gardening.   The patient also reports no significant exertional dyspnea since his  last visit.  He is compliant with his medications and has not smoked  tobacco in years.   Of note, the patient reports that he has not had any blood work done  since our last visit.   Electrocardiogram today reveals NSR with first-degree AV block at 61 BPM  with left axis deviation and nonspecific ST abnormalities.   CURRENT MEDICATIONS:  1. Full-dose aspirin.  2. Toprol XL 25 mg daily.  3. Simvastatin 10 mg q.h.s.  4. Altace 10 mg daily.  5. Hydrochlorothiazide 12.5 mg daily.   PHYSICAL EXAMINATION:  VITAL SIGNS:  Blood pressure 144/78, pulse 61,  regular.  Weight 176.6 (down 2 pounds).  GENERAL:  An 77 year old male sitting upright in no distress.  HEENT:  Normocephalic, atraumatic.  NECK:  Palpable bilateral carotid pulse without bruits.  No JVD at 90  degrees.  LUNGS:  Diminished breath sounds at the bases but without crackles or  wheezes.  HEART:  Regular rate and rhythm (S1, S2), soft S4.  No significant  murmurs.  ABDOMEN:  Soft, nontender, with intact bowel sounds.  EXTREMITIES:  Palpable pulses without edema.  NEUROLOGIC:  No focal deficits.   IMPRESSION:  1. Coronary artery disease.      a.     Non-ST elevation myocardial  infarction/emergent four-vessel       coronary artery bypass graft secondary to critical left main       disease March 2005.      b.     Ejection fraction 40% by catheterization; improved to 60% by       2 D echo July 2007.  2. Chronic first degree atrioventricular block.  3. Peripheral vascular disease.      a.     Mild infrarenal aneurysmal dilatation.      b.     Nonobstructive bilateral renal artery stenosis; 60% right       CIA stenosis.  4. Chronic obstructive pulmonary disease/interstitial lung disease.      a.     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  5. Hypertension.  6. Hyperlipidemia.  7. Mild lower extremity edema.   PLAN:  1. Schedule exercise stress Cardiolite for reassessment of patency of      bypass grafts given that he is now 3 years out from  his      revascularization surgery.  2. Continue current medication regimen, save for down-titration of      aspirin to 81 mg daily.  3. Check complete blood work with a complete metabolic profile and a      fasting lipid profile.  4. Order two-view chest x-ray for reassessment of interstitial lung      disease and as per prior recommendation at time of previous study      in July 2007.  5. Schedule return clinic follow-up with myself and Dr. Nona Dell in 6 months.      Gene Serpe, PA-C  Electronically Signed      Learta Codding, MD,FACC  Electronically Signed   GS/MedQ  DD: 08/07/2006  DT: 08/08/2006  Job #: [id number]   cc:   Doreen Beam

## 2010-10-15 NOTE — Op Note (Signed)
NAMEDECLIN, RAJAN                        ACCOUNT NO.:  [id number]   MEDICAL RECORD NO.:  [id number]                   PATIENT TYPE:  INP   LOCATION:  2314                                 FACILITY:  [facility]   PHYSICIAN:  Zenon Mayo, MD            DATE OF BIRTH:  Oct 08, 1917   DATE OF PROCEDURE:  07/30/2003  DATE OF DISCHARGE:                                 OPERATIVE REPORT   PROCEDURE PERFORMED:  Transesophageal echocardiogram.   ANESTHESIOLOGIST:  Zenon Mayo, MD   INDICATIONS FOR PROCEDURE:  Emergency coronary artery bypass surgery.  Mr.  Cassin is an 77 year old gentleman with a history of hypertension and  coronary artery disease who was brought to the emergency room emergently  this afternoon by Dr. Cornelius Moras for unstable angina and significant coronary  disease.   DESCRIPTION OF PROCEDURE:  Mr. Prusinski was placed on the operating room and  placed under general anesthesia.  After confirming endotracheal tube  placement, a transesophageal echo probe was placed into the esophagus  without any resistance.  On initial exam, the following was noted.   1. Left ventricle was normal in size.  There was no left ventricular     hypertrophy seen.  The ejection fraction was estimated to be 45%.  There     were no wall motion abnormalities noted.   The mitral valve was then imaged.  The valve appeared to move well with good  coaptation of the leaflets.  There was no mitral annular calcification seen.  With color Doppler mild mitral regurgitation was seen.   The aortic was then visualized. The aortic valve was trileaflet in nature.  No masses or calcifications noted.  The aortic valve area was 4 cm squared.  There was no aortic regurgitation appreciated.   The pulmonic and tricuspid valves were both normal in appearance with no  regurgitation or stenosis noted.   The interatrial septum was intact and there was no thrombus or mass noted in  the atrial appendage.   Lastly, the thoracic aorta was visualized.  This revealed severe  atherosclerotic disease with the largest plaque measuring 6 mm.   Post bypass exam.  At the conclusion of bypass, the left ventricular  function was well preserved and no further wall motion abnormalities were  seen.  The mitral and aortic valves were no different than prior to bypass.  The aortic cannula was removed without any evidence of aortic dissection.  At the conclusion of the procedure after the chest had been closed, the  transesophageal echo probe was removed without any resistance or evidence of  trauma to the esophagus.  The patient was taken to the intensive care unit  in stable fashion and will be monitored there from this point on.  Zenon Mayo, MD    WEF/MEDQ  D:  07/30/2003  T:  07/31/2003  Job:  [id number]

## 2010-10-15 NOTE — Assessment & Plan Note (Signed)
[facility]                            [facility] NOTE   NAME:[name], [name]                     MRN:          [id number]  DATE:12/09/2005                            DOB:          April 28, 1918    PRIMARY CARDIOLOGIST:  Dr. Simona Huh   REASON FOR OFFICE VISIT:  [name] returns for scheduled 1-week followup  here.  Please refer to Dorian Pod, NP's note of December 02, 2005 for full  details.   At that time, [name] presented for routine office followup.  He had not  been seen here in the office by Dr. Diona Browner since November 2006.   The patient reported some mild exertional dyspnea, but otherwise denied any  chest pain, PND, orthopnea, or lower extremity edema.   He was placed on low-dose Lasix at 20 mg daily and referred for extensive  blood work, x-ray, and 2-D echocardiogram.   Blood work notable for a BNP of 93, BUN 17, creatinine 1.2 with a potassium  of 4.3.  Chest x-ray suggestive of emphysematous changes as well as possible  interstitial fibrosis; repeat chest x-ray in 3-6 months was recommended.   A 2-D echocardiogram was done earlier today, and now reviewed by Dr. Andee Lineman:  This reveals normalization of LV function with an EF of 60%, normal wall  motion, and no significant valvular abnormalities.  There was suggestion of  possible diastolic dysfunction.   From a cardiological standpoint, the patient reports perhaps feeling  slightly better since being placed on Lasix.  However, he seems to feel that  he has no significant dyspnea and that this has not changed from 1 week ago.  He continues to deny exertional chest discomfort, PND, orthopnea, or lower  extremity edema.   CURRENT MEDICATIONS:  1.  Lasix 20 mg daily.  2.  Altace 5 mg daily.  3.  Zocor 10 mg q.h.s.  4.  Toprol XL 25 mg daily.  5.  Coated aspirin 325 mg daily.   PHYSICAL EXAMINATION:  VITAL SIGNS:  Blood pressure 140/82, pulse 66,  regular.   Weight 178 (down 4 pounds).  NECK:  Palpable carotid pulses without bruits; no JVD at 90 degrees.  LUNGS:  Bibasilar crackles (right greater than left); no wheezes.  HEART:  Regular rate and rhythm (S1, S2); no significant murmurs.  EXTREMITIES:  Intact pulses with no significant pedal edema.  NEURO:  Nonfocal exam.   IMPRESSION:  1.  Coronary artery disease.      1.  Status post non-ST elevation myocardial infarction.      2.  Four vessel coronary artery bypass graft secondary to critical left          main disease March 2005:  Left internal mammary artery - left          anterior descending; saphenous vein graft - first diagonal;          saphenous vein graft - obtuse marginal 2; saphenous vein graft -          posterior descending artery.      3.  Preoperative ejection fraction  40%/3+ mitral regurgitation.  2.  Peripheral vascular disease.      1.  Sixty percent right common iliac stenosis.      2.  History of mild infrarenal aneurysmal dilatation by previous          catheterization.      3.  Thirty percent bilateral renal artery stenosis.  3.  Chronic obstructive pulmonary disease/question interstitial lung      disease.      1.  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  4.  Hyperlipidemia.  5.  Hypertension.  6.  History of tobacco.   PLAN:  Following review with Dr. Lewayne Bunting, and in light of current  echocardiogram results revealing normalization of left ventricular function  and no further significant valvular abnormalities, our recommendation is to  discontinue Lasix and, instead, place patient on low dose  hydrochlorothiazide at 12.5 daily for added blood pressure control.  We will  check a followup BMET today and repeat this in 1 week for close monitoring  of electrolytes and renal function.   Additionally, the patient will need a followup chest x-ray in 3-6 months for  continued close monitoring of underlying pulmonary disease.   Schedule return visit to the clinic in 6  months for continued followup with  Dr. Simona Huh.                                   Gene Serpe, PA-C                                Learta Codding, MD, [facility]   GS/MedQ  DD:  12/09/2005  DT:  12/09/2005  Job #:  [id number]   cc:   Doreen Beam

## 2010-10-15 NOTE — Discharge Summary (Signed)
[name], [name]                        ACCOUNT NO.:  [id number]   MEDICAL RECORD NO.:  [id number]                   PATIENT TYPE:  INP   LOCATION:  2022                                 FACILITY:  [facility]   PHYSICIAN:  Salvatore Decent. Cornelius Moras, M.D.              DATE OF BIRTH:  07/29/1917   DATE OF ADMISSION:  07/30/2003  DATE OF DISCHARGE:  08/06/2003                                 DISCHARGE SUMMARY   PRIMARY PHYSICIAN:  Dr. Doreen Beam.   CARDIOLOGIST:  Dr. Veneda Melter.   ADMITTING DIAGNOSIS:  Unstable angina with non-ST elevation myocardial  infarction.   DISCHARGE/SECONDARY DIAGNOSES:  1. Unstable angina, status post non-Q wave myocardial infarction.  2. Critical left main disease, three vessel coronary artery disease, status     post coronary artery bypass grafting.  3. History of myocardial infarction, 1981.  4. History of hypertension.  5. History of asbestosis exposure.  6. Questionable pulmonary mass per chest x-ray, at [facility],     uncertain etiology.  Repeat chest CT scan recommended in 2-3 months.  7. Multiple pulmonary pleural plaques bilaterally per chest CT on August 04, 2003.  Findings were consistent with asbestosis exposure.  8. History of tobacco dependence.  Smokes a pipe.  9. Postoperative subcutaneous emphysema, resolving.   PROCEDURES:  1. On July 30, 2003:  [name] underwent emergency median sternotomy for     coronary artery bypass grafting x 4, using the left internal mammary     artery to the distal left anterior descending coronary artery, saphenous     vein graft to the first diagonal branch, saphenous vein graft to the     second circumflex marginal branch, saphenous vein graft to the posterior     descending coronary artery, and endoscopic saphenous vein harvesting from     the right thigh.  The surgeon was Dr. Tressie Stalker.  2. On July 30, 2003:  [name] underwent cardiac catheterization by Dr.     Veneda Melter.  The findings  showed three vessel coronary artery disease,     mild left ventricular systolic dysfunction, 3+ mitral regurgitation, and     non-ST elevation myocardial infarction.  3. On July 30, 2003:  [name] underwent intraoperative transesophageal     echocardiogram by Dr. Lacretia Nicks. Autumn Patty.  The findings showed the left     ventricle a normal size with an ejection fraction estimated to be 45%.     There was no wall motion abnormality noted.  Mild mitral regurgitation     was noted but no mitral annular calcification was seen.  The aortic valve     was trileaflet in nature with no masses or calcifications.  No aortic     regurgitation was noted.  Pulmonic and tricuspid valves were both normal     in appearance with no regurgitation or stenosis noted.  The intra-atrial  septum was intact with no thrombus or mass noted in the atrial appendage.     The thoracic aorta was visualized showing a severe atherosclerotic     disease with the largest plaque measuring 6-mm.   DIAGNOSTICS:  On August 04, 2003:  [name] underwent a chest CT scan to  evaluate _________ density over the left lung base that was found during his  chest x-ray from [facility] on July 30, 2003.  CT findings showed  extensive soft tissue emphysema with mild pseudomediastinum and small  bilateral hydropneumothoraces, status post recent median sternotomy and  CABG.  No definite pulmonary nodules were present.  There were multiple  pleural plaques bilaterally, some of which were partially calcified.  The  lower lobe assessment was thought to be limited by atelectasis.  Additional  chest radiographic followup was recommended.   ALLERGIES:  He has no known drug allergies.   BRIEF HISTORY:  [name] is an 77 year old Caucasian male who lives near  [place] and is followed by Dr. Doreen Beam with a history of coronary  artery disease and hypertension.  The patient reportedly had a history of  acute myocardial  infarction in 1981.  He has been doing well since then with  long term medical therapy.  However, approximately two weeks prior to his  admission, he developed new onset substernal chest pain occurring with  exertion.  The pain eventually subsided; however, the evening before his  current admission, the patient developed similar but more severe substernal  chest pain radiating across his chest and was associated with belching and  indigestion.  His pain persisted despite trying oral aspirin therapy,  prompting him to present to the emergency room in the early hours of July 30, 2003 at [facility]  By the time he had arrived, his pain  had resolved.  Initial cardiac enzymes were positive for an acute non-wave  myocardial infarction with a total CK of 67, with a CK-MB fraction of 4.8,  and a troponin I of 0.22.  Baseline echocardiogram demonstrated some ST  segment depression in the anterolateral leads, and T wave inversion in aVL.  He remained clinically stable and was promptly transferred to [facility] for further management and therapy.  On arrival, the patient was  brought directly to the cardiac catheterization laboratory where he  underwent cardiac catheterization by Dr. Chales Abrahams, results as described above.  Based on these findings, an emergency cardiac surgical consultation was  requested.   HOSPITAL COURSE:  On July 30, 2003, [name] was emergently transferred  from [facility] to [place] for unstable angina and non-ST  elevation myocardial infarction.  As stated above, he did undergo cardiac  catheterization by Dr. Chales Abrahams.  Based on the findings emergency cardiac  surgery consultation was requested.  The patient was then evaluated by Dr.  Cornelius Moras and it was felt that he would benefit from coronary artery bypass  grafting.  After discussing the risks, benefits, and alternatives with Dr. Cornelius Moras, the patient did agree to proceed.  He was taken  emergently to the  operating room where he underwent coronary artery bypass grafting x 4, as  described above.  The patient tolerated this procedure well.  He was  transferred in stable condition to the surgical intensive care unit.  Later  that evening, he remained stable and was in a sinus rhythm.  He also had  minimal chest tube output.   By postoperative day one, Mr.  Combs had been extubated and was  neurologically intact.  He was afebrile and remained in a normal sinus  rhythm.  His blood pressure was stable at 120/60 on a Neo-Synephrine drip.  His chest was clear, and he was saturating 95% on 4 liters per nasal  cannula.  His chest tube output remained low, and his chest tubes were  discontinued later that day.  His urine output, lab values were also stable.  Immediately postoperatively, his blood sugars were noted to be elevated as  high as 216.  However, throughout the course of his hospitalization they did  decrease and averaged 110-120.  Over the next several days, [name]  progressed.  He did require short term atrial pacing on the evening of  postoperative day one.  However, throughout the remaining course of his  hospitalization he remained in sinus rhythm in the 70s-90s.  He was  eventually weaned from any blood pressure stabilizing agents and at the time  of discharge, his blood pressure remained stable at 124/73 on a beta-blocker  and ACE inhibitor.  His weight was noted to be approximately up 10 pounds,  and he was started on diuretic therapy.  He did have good diuresis and prior  to discharge, he was only up a few pounds above his baseline.  His lower  extremity dopplers showed mild edema, and he was continued on a short course  of diuretic therapy at the time of his discharge.   From a respiratory status, he was eventually weaned from supplemental  oxygen.  A chest x-ray and chest CT scan were done during his  hospitalization with results as discussed above.   After his chest tubes were  removed, he also developed subcutaneous emphysema; however, followup reports  and examination did show that this was resolving.  His bowel and bladder  functions also returned to baseline.  His incisions also remained clean and  dry without signs of infection.  The patient also remained afebrile.  From a  rehab standpoint, he was ambulating independently with close supervision.   On postoperative day six, Dr. Cornelius Moras felt that [name] would be stable for  discharge the following day.  The patient reported that he lived at home  with his wife who would be able to provide 24 hour care, and anticipated  date of discharge will be August 06, 2003.  Official discharge orders pending  the patient's status during morning rounds.   RECENT LABORATORY DATA:  On August 05, 2003:  His white blood count was 122.4,  hemoglobin 9.9, hematocrit 30.4, platelet count 384.  Sodium 139, potassium 3.7, BUN 13, creatinine 1.2, blood glucose 113.  On August 04, 2003:  His AST  was 20, ALT 19, alkaline phosphatase 79, total bilirubin 0.6.   DISCHARGE MEDICATIONS:  1. Enteric coated aspirin 325 mg one p.o. every day.  2. Toprol XL 25 mg one p.o. every day.  3. Altace 2.5 mg one p.o. every day.  4. Zocor 20 mg one p.o. q.p.m.  5. Lasix 40 mg one p.o. every day x 7 days.  6. Potassium chloride 20 mEq one p.o. every day x 7 days.  7. Ultram 50 mg 1-2 tablets p.o. q.4-6h. p.r.n. pain.   ACTIVITY:  He is instructed to avoid driving, heavy lifting of more than 10  pounds and strenuous activity.  He may continue his daily walking and  breathing exercises.   DIET:  He is to follow a low fat, low salt diet.  WOUND CARE:  He may shower.  He may clean his wounds with mild soap and  water.  He is to notify the CVTS office if he develops fever greater than  101, or redness, swelling, or drainage from his incision sites.   FOLLOW UP:  1. He is to followup with Dr. Cornelius Moras in approximately three  weeks.  The office     has been notified and will contact him with the specific appointment date     and time.  He is instructed to bring his most recent chest x-ray with him     to this appointment.  2. He is to followup with the [facility].  The [place]     office has been notified of his anticipated discharge, and they will     contact him regarding a specific date and time.  He is to have a chest x-     ray done at his     followup appointment with his cardiologist.  3. He is to have a followup chest CT scan in 2-3 months.  The CVTS office     has been notified and Dr. Cornelius Moras can address this further at his followup     appointment.      Jerold Coombe, P.A.                  Salvatore Decent. Cornelius Moras, M.D.    AWZ/MEDQ  D:  08/05/2003  T:  08/06/2003  Job:  [id number]   cc:   Salvatore Decent. Cornelius Moras, M.D.  [address]  [place]  [place] [address]   Veneda Melter, M.D.   Doreen Beam  [address]  [place]  [place] [address]  Fax: [phone]

## 2010-10-15 NOTE — Cardiovascular Report (Signed)
[name], [name]                        ACCOUNT NO.:  [id number]   MEDICAL RECORD NO.:  [id number]                   PATIENT TYPE:  INP   LOCATION:  2314                                 FACILITY:  [facility]   PHYSICIAN:  Veneda Melter, M.D.                   DATE OF BIRTH:  January 12, 1918   DATE OF PROCEDURE:  07/30/2003  DATE OF DISCHARGE:                              CARDIAC CATHETERIZATION   PROCEDURES PERFORMED:  1. Left heart catheterization.  2. Left ventriculogram.  3. Selective coronary angiography.  4. Left subclavian internal mammary angiogram.  5. Abdominal aortogram.   DIAGNOSES:  1. Three-vessel coronary artery disease.  2. Mild left ventricular systolic dysfunction.  3. 3+ mitral regurgitation.  4. Non-ST-elevation myocardial infarction.   HISTORY:  [name] is an 77 year old white male with history of  hypertension who presents with substernal chest discomfort occurring at  rest.  The patient was admitted to [facility] where he subsequently  ruled in for non-ST-elevation myocardial infarction and diffuse ECG changes.  He had some recurrence of chest discomfort and was transferred to [facility] for further care.   TECHNIQUE:  Informed consent was obtained.  The patient brought to the  catheterization lab.  A 6 French sheath was placed in the right femoral  artery using the modified Seldinger technique.  A 6 [place] JL-4 and JR-4  catheter was then used to engage the left and right coronary arteries and  selective angiography performed in various projections using manual  injection contrast.  The JR-4 catheter was then positioned in the left  subclavian artery and nonselective opacification through mammary artery  performed using manual injection contrast.  Subsequently, a 6 French pigtail  catheter was advanced in the left ventricle and a left ventriculogram  performed using power injection contrast.  Pigtail catheter was brought back  in the abdominal  aorta and abdominal aortogram was performed using power  injection contrast.  After termination of the case, the catheters and  sheaths were removed.  Manual pressure applied until adequate hemostasis was  achieved.  The patient tolerated the procedure well and was transferred to  the operating room in stable condition.   FINDINGS:   LEFT HEART CATHETERIZATION:  1. Left main trunk:  Large caliber vessel.  There is a high grade lesion in     the mid section of 90%.  2. LAD:  This is a medium caliber vessel that provides the diagonal branch     in the proximal segment.  The LAD has moderate disease of 40-50% in the     proximal segment and then 100% occluded after the diagonal branch.  The     mid and distal LAD fills via collaterals from the right coronary artery     and exhibits diffuse disease of 80% in the mid section as well as further     disease of 80% at the  apex.  The first diagonal branch has proximal     disease of 50%.  3. Left circumflex artery:  This is a medium caliber vessel that provides     first marginal branch in the proximal segment, bifurcating second     marginal branch distally.  The first marginal branch has moderate     narrowing of 70% in the mid section. The bifurcating second marginal     branch has moderate narrowing of 60% in the superior division, 80% at the     origin of the small inferior division.  4. Right coronary artery is dominant.  This is a large caliber vessel that     provides posterior descending artery and posterior ventricular branch in     the terminal segment.  The right coronary artery is heavily calcified and     has severe diffuse disease of 70% along its entire mid course.  The     distal RCA has moderate narrowing of 60% involving the bifurcation of the     PDA.   LEFT VENTRICULOGRAPHY:  1. Mildly dilated end-systolic dimension.  2. Overall left ventricular function is mildly impaired.  3. Ejection fraction is approximately 40%.   There is akinesis of the mid     anterior wall.  4. 3+ mitral regurgitation is noted.  5. LV pressure is 150/20.  6. Aortic pressure is 150/80.  7. LVEDP equals 30.  8. Left subclavian artery is patent.  There is moderate disease of 30%.  The     internal mammary artery is normal caliber and extends to the diaphragm.  9. Abdominal aorta has severe atherosclerotic disease with mild aneurysmal     dilatation in the infrarenal segment.  The renal artery is single and     patent bilaterally with mild disease of 30%.  The iliac arteries are     patent and heavily calcified with moderate narrowing of 60% in the right     iliac artery.   ASSESSMENT AND PLAN:  [name] is an 77 year old white male with advanced  three-vessel coronary artery disease who presents with unstable angina and  is ruled in for non-ST-elevation myocardial infarction.  He has mild LV  dysfunction and significant mitral regurgitation.  Due to the critical  nature of his stenosis, he will be referred for emergent bypass surgery.  The case was discussed with Dr. Cornelius Moras.  Further assessment of the mitral  regurgitation will be made with intraoperative TEE.                                               Veneda Melter, M.D.    NG/MEDQ  D:  07/30/2003  T:  07/31/2003  Job:  [id number]   cc:   Doreen Beam  [address]  [place]  [place] [address]  Fax: [phone]   Jonelle Sidle, M.D. [facility]

## 2010-10-20 ENCOUNTER — Other Ambulatory Visit: Payer: Self-pay | Admitting: *Deleted

## 2010-10-20 MED ORDER — SIMVASTATIN 10 MG PO TABS: 5.0000 mg | ORAL_TABLET | Freq: Every evening | ORAL | Status: DC

## 2011-02-21 ENCOUNTER — Other Ambulatory Visit: Payer: Self-pay | Admitting: *Deleted

## 2011-02-21 MED ORDER — METOPROLOL SUCCINATE ER 25 MG PO TB24: 25.0000 mg | ORAL_TABLET | Freq: Every day | ORAL | Status: DC

## 2011-03-31 ENCOUNTER — Other Ambulatory Visit: Payer: Self-pay | Admitting: *Deleted

## 2011-03-31 MED ORDER — HYDROCHLOROTHIAZIDE 25 MG PO TABS: 12.5000 mg | ORAL_TABLET | Freq: Every day | ORAL | Status: DC

## 2011-05-03 ENCOUNTER — Other Ambulatory Visit: Payer: Self-pay | Admitting: *Deleted

## 2011-05-03 DIAGNOSIS — E785 Hyperlipidemia, unspecified: Secondary | ICD-10-CM

## 2011-05-03 DIAGNOSIS — I25810 Atherosclerosis of coronary artery bypass graft(s) without angina pectoris: Secondary | ICD-10-CM

## 2011-05-03 DIAGNOSIS — Z79899 Other long term (current) drug therapy: Secondary | ICD-10-CM

## 2011-05-09 ENCOUNTER — Other Ambulatory Visit: Payer: Self-pay | Admitting: *Deleted

## 2011-05-09 MED ORDER — HYDROCHLOROTHIAZIDE 25 MG PO TABS: 12.5000 mg | ORAL_TABLET | Freq: Every day | ORAL | Status: DC

## 2011-05-18 ENCOUNTER — Encounter: Payer: Self-pay | Admitting: *Deleted

## 2011-05-30 ENCOUNTER — Other Ambulatory Visit: Payer: Self-pay | Admitting: *Deleted

## 2011-05-30 MED ORDER — METOPROLOL SUCCINATE ER 25 MG PO TB24: 25.0000 mg | ORAL_TABLET | Freq: Every day | ORAL | Status: DC

## 2011-06-10 ENCOUNTER — Encounter: Payer: Self-pay | Admitting: *Deleted

## 2011-06-13 ENCOUNTER — Encounter: Payer: Self-pay | Admitting: Cardiology

## 2011-06-13 ENCOUNTER — Ambulatory Visit (INDEPENDENT_AMBULATORY_CARE_PROVIDER_SITE_OTHER): Payer: Medicare Other | Admitting: Cardiology

## 2011-06-13 VITALS — BP 163/80 | HR 66 | Temp 98.1°F | Ht 69.0 in | Wt 170.0 lb

## 2011-06-13 DIAGNOSIS — E782 Mixed hyperlipidemia: Secondary | ICD-10-CM

## 2011-06-13 DIAGNOSIS — I2510 Atherosclerotic heart disease of native coronary artery without angina pectoris: Secondary | ICD-10-CM

## 2011-06-13 DIAGNOSIS — I10 Essential (primary) hypertension: Secondary | ICD-10-CM

## 2011-06-13 MED ORDER — NITROGLYCERIN 0.4 MG SL SUBL: 0.4000 mg | SUBLINGUAL_TABLET | SUBLINGUAL | Status: DC | PRN

## 2011-06-13 NOTE — Progress Notes (Signed)
   Clinical Summary [name] is a 77 y.o.male presenting for followup. He was seen in July of 2011.  Recent lab work showed AST 23, ALT 16, triglycerides 162, cholesterol 129, LDL 57, HDL 40. We reviewed these today.  [name] continues to do fairly well without any progressive angina symptoms. He stays as active as possible, including tending his garden, housework.  We reviewed his medications. He has no recent refill for nitroglycerin.   No Known Allergies  Current Outpatient Prescriptions  Medication Sig Dispense Refill  . aspirin 325 MG tablet Take 162 mg by mouth daily.      . hydrochlorothiazide (HYDRODIURIL) 25 MG tablet Take 0.5 tablets (12.5 mg total) by mouth daily.  15 tablet  0  . metoprolol succinate (TOPROL XL) 25 MG 24 hr tablet Take 1 tablet (25 mg total) by mouth daily.  90 tablet  0  . Multiple Vitamins-Minerals (CENTRUM SILVER PO) Take 1 tablet by mouth daily.      . ramipril (ALTACE) 10 MG capsule Take 10 mg by mouth daily.      . simvastatin (ZOCOR) 10 MG tablet Take 0.5 tablets (5 mg total) by mouth every evening.  90 tablet  3  . nitroGLYCERIN (NITROSTAT) 0.4 MG SL tablet Place 1 tablet (0.4 mg total) under the tongue every 5 (five) minutes as needed for chest pain.  25 tablet  3    Past Medical History  Diagnosis Date  . Coronary atherosclerosis of native coronary artery   . Mixed hyperlipidemia   . Essential hypertension, benign   . PVD (peripheral vascular disease)   . Myocardial infarction     Remote NSTEMI  . Carotid artery disease     Mild    Past Surgical History  Procedure Date  . Femoral hernia repair   . Coronary artery bypass graft 07/2003     Dr. Cornelius Moras: LIMA to LAD, SVG to diagonal and OM, SVG to PDA    Family History  Problem Relation Age of Onset  . Coronary artery disease      Family h/o    Social History [name] reports that he quit smoking about 33 years ago. His smoking use included Cigarettes and Pipe. He has a 5  pack-year smoking history. He has never used smokeless tobacco. [name] reports that he does not drink alcohol.  Review of Systems No palpitations, orthopnea, PND. No lower extremity edema. Recent upper respiratory tract infection. No fevers or chills. Stable appetite. Otherwise negative.  Physical Examination Filed Vitals:   06/13/11 0927  BP: 163/80  Pulse: 66  Temp: 98.1 F (36.7 C)    Normally nourished elderly male in no acute distress.  HEENT: Conjunctiva and lids normal, oropharynx with moist mucosa.  Neck: Supple, no elevated JVP or bruits.  Lungs: Clear to auscultation, no murmur.  Cardiac: Regular rate and rhythm, soft systolic murmur at the base, preserved second heart sound, no S3  Abdomen: Soft, nontender, bowel sounds present.  Extremities: No pitting edema.   ECG Reviewed in EMR.   Problem List and Plan

## 2011-06-13 NOTE — Assessment & Plan Note (Signed)
Remains symptomatically stable on medical therapy. ECG is reviewed. We have discussed followup testing over time, and he tends to prefer observation only. Refill for fresh nitroglycerin was provided. Continue annual followup, sooner if needed. He continues to see Dr. Sherril Croon.

## 2011-06-13 NOTE — Patient Instructions (Signed)
Your physician you to follow up in 1 year. You will receive a reminder letter in the mail one-two months in advance. If you don't receive a letter, please call our office to schedule the follow-up appointment. Your physician recommends that you continue on your current medications as directed. Please refer to the Current Medication list given to you today. Your physician recommends that you go to the [facility] for a FASTING lipid profile and liver function labs. Do not eat or drink after midnight. DO LABS A FEW DAYS BEFORE YOUR FOLLOW UP APPT IN 1 YEAR.

## 2011-06-13 NOTE — Assessment & Plan Note (Signed)
Well-controlled, no changes to current regimen. 

## 2011-06-13 NOTE — Assessment & Plan Note (Signed)
Blood pressure elevated today, asymptomatic. He reports compliance with medications. Recommend keeping follow up with Dr. Sherril Croon.

## 2011-06-15 ENCOUNTER — Other Ambulatory Visit: Payer: Self-pay | Admitting: *Deleted

## 2011-06-15 MED ORDER — HYDROCHLOROTHIAZIDE 25 MG PO TABS: 12.5000 mg | ORAL_TABLET | Freq: Every day | ORAL | Status: DC

## 2011-06-24 ENCOUNTER — Other Ambulatory Visit: Payer: Self-pay | Admitting: *Deleted

## 2011-06-24 MED ORDER — RAMIPRIL 10 MG PO CAPS: 10.0000 mg | ORAL_CAPSULE | Freq: Every day | ORAL | Status: DC

## 2011-08-30 ENCOUNTER — Other Ambulatory Visit: Payer: Self-pay | Admitting: *Deleted

## 2011-08-30 MED ORDER — METOPROLOL SUCCINATE ER 25 MG PO TB24: 25.0000 mg | ORAL_TABLET | Freq: Every day | ORAL | Status: DC

## 2011-10-28 ENCOUNTER — Other Ambulatory Visit: Payer: Self-pay | Admitting: *Deleted

## 2011-10-28 MED ORDER — SIMVASTATIN 10 MG PO TABS: 5.0000 mg | ORAL_TABLET | Freq: Every evening | ORAL | Status: DC

## 2011-12-16 ENCOUNTER — Other Ambulatory Visit: Payer: Self-pay | Admitting: Cardiology

## 2011-12-16 MED ORDER — HYDROCHLOROTHIAZIDE 25 MG PO TABS: 12.5000 mg | ORAL_TABLET | Freq: Every day | ORAL | Status: DC

## 2012-01-17 ENCOUNTER — Other Ambulatory Visit: Payer: Self-pay | Admitting: *Deleted

## 2012-01-17 MED ORDER — RAMIPRIL 10 MG PO CAPS: 10.0000 mg | ORAL_CAPSULE | Freq: Every day | ORAL | Status: DC

## 2012-03-09 ENCOUNTER — Other Ambulatory Visit: Payer: Self-pay | Admitting: *Deleted

## 2012-03-09 DIAGNOSIS — Z79899 Other long term (current) drug therapy: Secondary | ICD-10-CM

## 2012-03-09 DIAGNOSIS — E782 Mixed hyperlipidemia: Secondary | ICD-10-CM

## 2012-06-13 ENCOUNTER — Telehealth: Payer: Self-pay | Admitting: *Deleted

## 2012-06-13 NOTE — Telephone Encounter (Signed)
Patient informed. 

## 2012-06-13 NOTE — Telephone Encounter (Signed)
Left message for patient to call office.  

## 2012-06-13 NOTE — Telephone Encounter (Signed)
Message copied by Eustace Moore on Wed Jun 13, 2012  9:44 AM ------      Message from: Jonelle Sidle      Created: Tue Jun 12, 2012 10:36 AM       Reviewed. LFTs normal and cholesterol very well controlled. Continue same.

## 2012-06-15 ENCOUNTER — Ambulatory Visit (INDEPENDENT_AMBULATORY_CARE_PROVIDER_SITE_OTHER): Payer: Medicare Other | Admitting: Cardiology

## 2012-06-15 ENCOUNTER — Encounter: Payer: Self-pay | Admitting: Cardiology

## 2012-06-15 VITALS — BP 124/73 | HR 52 | Ht 71.0 in | Wt 176.0 lb

## 2012-06-15 DIAGNOSIS — I10 Essential (primary) hypertension: Secondary | ICD-10-CM

## 2012-06-15 DIAGNOSIS — I2510 Atherosclerotic heart disease of native coronary artery without angina pectoris: Secondary | ICD-10-CM

## 2012-06-15 DIAGNOSIS — E782 Mixed hyperlipidemia: Secondary | ICD-10-CM

## 2012-06-15 NOTE — Assessment & Plan Note (Signed)
Continues on Zocor with excellent lipid control, LDL 45.

## 2012-06-15 NOTE — Patient Instructions (Addendum)

## 2012-06-15 NOTE — Assessment & Plan Note (Signed)
I recommended that he stay off of the HCTZ with recent addition of his prostate medication to avoid any problems with orthostasis or dizziness.

## 2012-06-15 NOTE — Progress Notes (Signed)
Clinical Summary [name] is a 79 y.o.male presenting for followup. He was seen in January 2013. He continues to do very well, has had no angina symptoms, no cardiac hospitalizations. Still tries to stay active, works outside on his land, fixes his tractor, plows his garden, has been working on his grapevine recently.  Recent lab work reviewed finding normal AST and ALT, cholesterol 112, triglycerides 121, HDL 43, and LDL 45. ECG today shows sinus bradycardia with prolonged PR interval and nonconducted PAC, heart rate in the 50s. He reports no problems with dizziness or syncope.  He has had some frequent urination, stated he was diagnosed with prostatic hypertrophy by Dr. Sherril Croon, now on [place]. He reports improved symptoms. He also tells me that he stop his HCTZ, has not resumed it. We discussed staying off the medication to avoid any potential volume contraction and dizziness related to orthostasis with the addition of his new prostate medication. His blood pressure looks reasonable today.   No Known Allergies  Current Outpatient Prescriptions  Medication Sig Dispense Refill  . aspirin 325 MG tablet Take 162 mg by mouth daily.      . Dutasteride-Tamsulosin HCl (JALYN) 0.5-0.4 MG CAPS Take 1 tablet by mouth daily.      . metoprolol succinate (TOPROL XL) 25 MG 24 hr tablet Take 1 tablet (25 mg total) by mouth daily.  90 tablet  3  . Multiple Vitamins-Minerals (CENTRUM SILVER PO) Take 1 tablet by mouth daily.      . nitroGLYCERIN (NITROSTAT) 0.4 MG SL tablet Place 1 tablet (0.4 mg total) under the tongue every 5 (five) minutes as needed for chest pain.  25 tablet  3  . ramipril (ALTACE) 10 MG capsule Take 1 capsule (10 mg total) by mouth daily.  30 capsule  6  . simvastatin (ZOCOR) 10 MG tablet Take 0.5 tablets (5 mg total) by mouth every evening.  90 tablet  3    Past Medical History  Diagnosis Date  . Coronary atherosclerosis of native coronary artery   . Mixed hyperlipidemia   .  Essential hypertension, benign   . PVD (peripheral vascular disease)   . Myocardial infarction     Remote NSTEMI  . Carotid artery disease     Mild    Past Surgical History  Procedure Date  . Femoral hernia repair   . Coronary artery bypass graft 07/2003     Dr. Cornelius Moras: LIMA to LAD, SVG to diagonal and OM, SVG to PDA    Social History Mr. Canning reports that he quit smoking about 34 years ago. His smoking use included Cigarettes and Pipe. He has a 5 pack-year smoking history. He has never used smokeless tobacco. Mr. Mauch reports that he does not drink alcohol.  Review of Systems No orthopnea or PND. Occasional arthritic pains. Cataract surgery about 3 weeks ago. Stable appetite. No reported bleeding problems. Otherwise negative  Physical Examination Filed Vitals:   06/15/12 1357  BP: 124/73  Pulse: 52   Filed Weights   06/15/12 1357  Weight: 176 lb (79.833 kg)    Normally nourished elderly male in no acute distress.  HEENT: Conjunctiva and lids normal, oropharynx with moist mucosa.  Neck: Supple, no elevated JVP or bruits.  Lungs: Clear to auscultation, no murmur.  Cardiac: Regular rate and rhythm, soft systolic murmur at the base, preserved second heart sound, no S3  Abdomen: Soft, nontender, bowel sounds present.  Extremities: No pitting edema.     Problem List and Plan  CORONARY ATHEROSCLEROSIS NATIVE CORONARY ARTERY Symptomatically stable on medical therapy. He has done very well over time. ECG reviewed. He is bradycardic but asymptomatic. Continue observation.  ESSENTIAL HYPERTENSION, BENIGN I recommended that he stay off of the HCTZ with recent addition of his prostate medication to avoid any problems with orthostasis or dizziness.  MIXED HYPERLIPIDEMIA Continues on Zocor with excellent lipid control, LDL 45.    Jonelle Sidle, M.D., F.A.C.C.

## 2012-06-15 NOTE — Assessment & Plan Note (Signed)
Symptomatically stable on medical therapy. He has done very well over time. ECG reviewed. He is bradycardic but asymptomatic. Continue observation.

## 2012-07-30 ENCOUNTER — Other Ambulatory Visit: Payer: Self-pay | Admitting: *Deleted

## 2012-07-30 MED ORDER — RAMIPRIL 10 MG PO CAPS: 10.0000 mg | ORAL_CAPSULE | Freq: Every day | ORAL | Status: DC

## 2012-08-20 ENCOUNTER — Other Ambulatory Visit: Payer: Self-pay | Admitting: Cardiology

## 2012-08-20 MED ORDER — METOPROLOL SUCCINATE ER 25 MG PO TB24: 25.0000 mg | ORAL_TABLET | Freq: Every day | ORAL | Status: DC

## 2012-11-20 ENCOUNTER — Other Ambulatory Visit: Payer: Self-pay | Admitting: Cardiology

## 2012-11-20 NOTE — Telephone Encounter (Signed)
Medication sent via escribe for Simvastatin.

## 2013-02-22 ENCOUNTER — Other Ambulatory Visit: Payer: Self-pay | Admitting: Cardiology

## 2013-02-22 MED ORDER — RAMIPRIL 10 MG PO CAPS: 10.0000 mg | ORAL_CAPSULE | Freq: Every day | ORAL | Status: DC

## 2013-06-26 ENCOUNTER — Encounter: Payer: Self-pay | Admitting: Cardiovascular Disease

## 2013-06-26 ENCOUNTER — Ambulatory Visit (INDEPENDENT_AMBULATORY_CARE_PROVIDER_SITE_OTHER): Payer: Medicare Other | Admitting: Cardiovascular Disease

## 2013-06-26 VITALS — BP 204/88 | HR 57 | Ht 70.5 in | Wt 174.0 lb

## 2013-06-26 DIAGNOSIS — I10 Essential (primary) hypertension: Secondary | ICD-10-CM

## 2013-06-26 DIAGNOSIS — I2510 Atherosclerotic heart disease of native coronary artery without angina pectoris: Secondary | ICD-10-CM

## 2013-06-26 DIAGNOSIS — E782 Mixed hyperlipidemia: Secondary | ICD-10-CM

## 2013-06-26 MED ORDER — AMLODIPINE BESYLATE 5 MG PO TABS: 5.0000 mg | ORAL_TABLET | Freq: Every day | ORAL | Status: DC

## 2013-06-26 NOTE — Patient Instructions (Signed)
   Begin Norvasc 5mg  daily - new sent to pharm Continue all other medications.   Nurse visit in 2 weeks for blood pressure check Your physician wants you to follow up in: 6 months.  You will receive a reminder letter in the mail one-two months in advance.  If you don't receive a letter, please call our office to schedule the follow up appointment

## 2013-06-26 NOTE — Progress Notes (Signed)
Patient ID: [name], male   DOB: 10/25/1917, 80 y.o.   MRN: [id number]      SUBJECTIVE: The patient is a 80 year old male who is here for routine cardiovascular followup. He keeps busy gardening and working on his Medical illustrator. He also cares for his wife and cooks for her. He denies chest pain but does get short of breath from time to time. ECG today shows sinus bradycardia, 57 beats per minute, first-degree AV block with PR interval of 308 ms, left anterior fascicular block, nonconducted PAC, and old anteroseptal infarct.   No Known Allergies  Current Outpatient Prescriptions  Medication Sig Dispense Refill  . aspirin 325 MG tablet Take 162 mg by mouth daily.      . Dutasteride-Tamsulosin HCl (JALYN) 0.5-0.4 MG CAPS Take 1 tablet by mouth daily.      . metoprolol succinate (TOPROL XL) 25 MG 24 hr tablet Take 1 tablet (25 mg total) by mouth daily.  90 tablet  3  . Multiple Vitamins-Minerals (CENTRUM SILVER PO) Take 1 tablet by mouth daily.      . nitroGLYCERIN (NITROSTAT) 0.4 MG SL tablet Place 1 tablet (0.4 mg total) under the tongue every 5 (five) minutes as needed for chest pain.  25 tablet  3  . ramipril (ALTACE) 10 MG capsule Take 1 capsule (10 mg total) by mouth daily.  30 capsule  6  . simvastatin (ZOCOR) 10 MG tablet TAKE 1/2 TABLET BY MOUTH EVERY DAY EVERY EVENING  90 tablet  2   No current facility-administered medications for this visit.    Past Medical History  Diagnosis Date  . Coronary atherosclerosis of native coronary artery   . Mixed hyperlipidemia   . Essential hypertension, benign   . PVD (peripheral vascular disease)   . Myocardial infarction     Remote NSTEMI  . Carotid artery disease     Mild    Past Surgical History  Procedure Laterality Date  . Femoral hernia repair    . Coronary artery bypass graft  07/2003     Dr. Roxy Manns: LIMA to LAD, SVG to diagonal and OM, SVG to PDA    History   Social History  . Marital Status: Married   Spouse Name: N/A    Number of Children: N/A  . Years of Education: N/A   Occupational History  . Not on file.   Social History Main Topics  . Smoking status: Former Smoker -- 0.50 packs/day for 10 years    Types: Cigarettes, Pipe    Quit date: 05/30/1978  . Smokeless tobacco: Never Used  . Alcohol Use: No  . Drug Use: No  . Sexual Activity: Not on file   Other Topics Concern  . Not on file   Social History Narrative  . No narrative on file     Filed Vitals:   06/26/13 1022 06/26/13 1029 06/26/13 1031  BP:  211/82 204/88  Pulse:   57  Height: 5' 10.5" (1.791 m)    Weight: 174 lb (78.926 kg)      PHYSICAL EXAM General: NAD Neck: No JVD, no thyromegaly or thyroid nodule.  Lungs: Clear to auscultation bilaterally with normal respiratory effort. CV: Nondisplaced PMI.  Heart regular S1/S2, no S3/S4, no murmur.  No peripheral edema.  No carotid bruit.  Normal pedal pulses.  Abdomen: Soft, nontender, no hepatosplenomegaly, no distention.  Neurologic: Alert and oriented x 3.  Psych: Normal affect. Extremities: No clubbing or cyanosis.   ECG: reviewed and available  in electronic records.      ASSESSMENT AND PLAN: CORONARY ATHEROSCLEROSIS NATIVE CORONARY ARTERY  Symptomatically stable on medical therapy. He has done very well over time. ECG reviewed. He is bradycardic but asymptomatic. Continue observation.   ESSENTIAL HYPERTENSION, BENIGN  He is markedly hypertensive. I will start amlodipine 5 mg daily and have him return for a nurse BP check in 2 weeks.  MIXED HYPERLIPIDEMIA  Continues on Zocor.  Dispo: f/u with Dr. Domenic Polite in 6 months, and with a nurse for BP check in 2 weeks.   Kate Sable, M.D., F.A.C.C.

## 2013-07-10 ENCOUNTER — Ambulatory Visit (INDEPENDENT_AMBULATORY_CARE_PROVIDER_SITE_OTHER): Payer: Medicare Other | Admitting: *Deleted

## 2013-07-10 VITALS — BP 138/70 | HR 56 | Ht 71.0 in | Wt 172.0 lb

## 2013-07-10 DIAGNOSIS — I10 Essential (primary) hypertension: Secondary | ICD-10-CM

## 2013-07-10 NOTE — Progress Notes (Signed)
Patient presents to office today for nurse bp check which was requested at recent office visit. Patient has taken all doses of medications and no side effects notes. Patient denies dizziness, sob or chest pain.

## 2013-08-09 ENCOUNTER — Other Ambulatory Visit: Payer: Self-pay | Admitting: Cardiology

## 2013-08-09 MED ORDER — METOPROLOL SUCCINATE ER 25 MG PO TB24: 25.0000 mg | ORAL_TABLET | Freq: Every day | ORAL | Status: DC

## 2013-10-16 ENCOUNTER — Other Ambulatory Visit: Payer: Self-pay | Admitting: Cardiology

## 2013-10-16 MED ORDER — RAMIPRIL 10 MG PO CAPS: 10.0000 mg | ORAL_CAPSULE | Freq: Every day | ORAL | Status: DC

## 2014-01-25 ENCOUNTER — Other Ambulatory Visit: Payer: Self-pay | Admitting: Cardiology

## 2014-02-19 ENCOUNTER — Encounter: Payer: Self-pay | Admitting: *Deleted

## 2014-02-19 NOTE — Progress Notes (Signed)
Patient walked into office this morning requesting to be seen for SOB. Patient c/o increased sob for the past 2 days and c/o coughing and congestion for about 1.5 weeks. Patient denied chest pain, dizziness, fever, chills. No visible edema noted to extremities. Wheezing noted with exhaling without stethoscope. Patient did say the SOB is better today than on yesterday. MD informed and suggest that patient go to the ED. Patient said he would go to [facility] ED now for an evaluation. Nurse offered to arrange transportation for patient but he declined saying he would drive himself.

## 2014-03-11 ENCOUNTER — Ambulatory Visit (INDEPENDENT_AMBULATORY_CARE_PROVIDER_SITE_OTHER): Payer: Medicare Other | Admitting: Cardiology

## 2014-03-11 ENCOUNTER — Other Ambulatory Visit: Payer: Self-pay | Admitting: *Deleted

## 2014-03-11 ENCOUNTER — Encounter: Payer: Self-pay | Admitting: Cardiology

## 2014-03-11 VITALS — BP 163/85 | HR 75 | Ht 70.0 in | Wt 167.0 lb

## 2014-03-11 DIAGNOSIS — E782 Mixed hyperlipidemia: Secondary | ICD-10-CM

## 2014-03-11 DIAGNOSIS — R0602 Shortness of breath: Secondary | ICD-10-CM

## 2014-03-11 DIAGNOSIS — I10 Essential (primary) hypertension: Secondary | ICD-10-CM

## 2014-03-11 DIAGNOSIS — I2510 Atherosclerotic heart disease of native coronary artery without angina pectoris: Secondary | ICD-10-CM

## 2014-03-11 MED ORDER — AMLODIPINE BESYLATE 5 MG PO TABS: 7.5000 mg | ORAL_TABLET | Freq: Every day | ORAL | Status: DC

## 2014-03-11 NOTE — Assessment & Plan Note (Addendum)
He is not reporting any angina symptoms. Recent cardiac markers negative during hospitalization. LVEF identified in the range of 40-45% by echocardiogram done at [facility]. Probably has had some degree of developing graft disease over the years. We plan to continue medical therapy for now.

## 2014-03-11 NOTE — Progress Notes (Signed)
Clinical Summary Mr. Webb is a 80 y.o.male last seen in the office by Dr. Bronson Ing in January of this year. He was recently admitted to [facility] in September with increasing shortness of breath and hypertension. Cardiac markers argued against ACS. Chest x-ray interestingly showed emphysematous changes with bibasilar interstitial lung disease and question of developing pulmonary fibrosis. Also calcified pleural plaque noted. He was managed by Dr. Woody Seller.  Recent lab work showed BUN 18, creatinine 1.0, potassium 4.0, troponin I negative, BNP 1104 down to 919, hemoglobin 12.5, platelets 307.  Lasix has been added to his regimen. He states he feels somewhat better. Family member with him says that he has been slowing down over the last few weeks. He does not endorse any chest pain or palpitations.  Echocardiogram done at [facility] just recently reported mild LVH with LVEF 40-45%, distal anterior/anteroseptal hypokinesis to akinesis, diastolic dysfunction, mild to moderate mitral regurgitation, trace aortic regurgitation, mild tricuspid regurgitation with RVSP 40-50 mm mercury.   No Known Allergies  Current Outpatient Prescriptions  Medication Sig Dispense Refill  . amLODipine (NORVASC) 5 MG tablet Take 1.5 tablets (7.5 mg total) by mouth daily.  45 tablet  3  . aspirin 325 MG tablet Take 162 mg by mouth daily.      . Dutasteride-Tamsulosin HCl (JALYN) 0.5-0.4 MG CAPS Take 1 tablet by mouth daily.      . furosemide (LASIX) 40 MG tablet Take 20 mg by mouth daily.      Marland Kitchen lisinopril (PRINIVIL,ZESTRIL) 20 MG tablet Take 20 mg by mouth daily.      . metoprolol succinate (TOPROL XL) 25 MG 24 hr tablet Take 1 tablet (25 mg total) by mouth daily.  90 tablet  3  . Multiple Vitamins-Minerals (CENTRUM SILVER PO) Take 1 tablet by mouth daily.      . nitroGLYCERIN (NITROSTAT) 0.4 MG SL tablet Place 0.4 mg under the tongue every 5 (five) minutes as needed for chest pain.      . simvastatin  (ZOCOR) 10 MG tablet TAKE 1/2 TABLET BY MOUTH EVERY DAY EVERY EVENING  15 tablet  1   No current facility-administered medications for this visit.    Past Medical History  Diagnosis Date  . Coronary atherosclerosis of native coronary artery   . Mixed hyperlipidemia   . Essential hypertension, benign   . PVD (peripheral vascular disease)   . Myocardial infarction     Remote NSTEMI  . Carotid artery disease     Mild    Past Surgical History  Procedure Laterality Date  . Femoral hernia repair    . Coronary artery bypass graft  07/2003     Dr. Roxy Manns: LIMA to LAD, SVG to diagonal and OM, SVG to PDA    Social History Mr. Christofferson reports that he quit smoking about 35 years ago. His smoking use included Cigarettes and Pipe. He has a 5 pack-year smoking history. He has never used smokeless tobacco. Mr. Riffe reports that he does not drink alcohol.  Review of Systems Other systems reviewed and negative except as outlined.  Physical Examination Filed Vitals:   03/11/14 1009  BP: 163/85  Pulse: 75   Filed Weights   03/11/14 1009  Weight: 167 lb (75.751 kg)    Normally nourished elderly male in no acute distress.  HEENT: Conjunctiva and lids normal, oropharynx with moist mucosa.  Neck: Supple, no elevated JVP or bruits.  Lungs: Clear to auscultation, no murmur.  Cardiac: Regular rate and rhythm,  soft systolic murmur at the base, preserved second heart sound, no S3  Abdomen: Soft, nontender, bowel sounds present.  Extremities: No pitting edema.  Skin: Warm and dry. Muscular skeletal: No kyphosis. Neuropsychiatric: Alert and oriented x3, hard of hearing. Affect appropriate.   Problem List and Plan   CORONARY ATHEROSCLEROSIS NATIVE CORONARY ARTERY He is not reporting any angina symptoms. Recent cardiac markers negative during hospitalization. LVEF identified in the range of 40-45% by echocardiogram done at [facility]. Probably has had some degree of developing  graft disease over the years. We plan to continue medical therapy for now.  Essential hypertension, benign Reviewed home blood pressure checks. Still not optimally controlled. Increase Norvasc to 7.5 mg daily, may need further up titration. Might even consider adding Imdur next in case there is any ischemic component.  Shortness of breath Agree with addition of Lasix due to concerns about diastolic heart failure being a potential contributor. Chest x-ray also raise the possibility of pulmonary fibrosis. Should keep followup with Dr. Woody Seller. Recheck BMET for next visit.  Mixed hyperlipidemia He continues on Zocor.    Satira Sark, M.D., F.A.C.C.

## 2014-03-11 NOTE — Patient Instructions (Signed)
Your physician recommends that you schedule a follow-up appointment in: 6 weeks. Your physician has recommended you make the following change in your medication:  Increase your norvasc (amlodipine) to 7.5 mg daily. Please take 1&1/2 tablets of your norvasc daily. Your new prescription was sent to your pharmacy. Continue all other medications the same. Your physician recommends that you have lab work in about 6 weeks just before your next visit to check your BMET.

## 2014-03-11 NOTE — Assessment & Plan Note (Signed)
Agree with addition of Lasix due to concerns about diastolic heart failure being a potential contributor. Chest x-ray also raise the possibility of pulmonary fibrosis. Should keep followup with Dr. Woody Seller. Recheck BMET for next visit.

## 2014-03-11 NOTE — Assessment & Plan Note (Addendum)
Reviewed home blood pressure checks. Still not optimally controlled. Increase Norvasc to 7.5 mg daily, may need further up titration. Might even consider adding Imdur next in case there is any ischemic component.

## 2014-03-11 NOTE — Assessment & Plan Note (Signed)
He continues on Zocor. 

## 2014-03-27 ENCOUNTER — Other Ambulatory Visit: Payer: Self-pay | Admitting: Cardiology

## 2014-04-22 ENCOUNTER — Encounter: Payer: Self-pay | Admitting: Cardiology

## 2014-04-22 ENCOUNTER — Ambulatory Visit (INDEPENDENT_AMBULATORY_CARE_PROVIDER_SITE_OTHER): Payer: Medicare Other | Admitting: Cardiology

## 2014-04-22 VITALS — BP 142/62 | HR 57 | Ht 70.0 in | Wt 168.0 lb

## 2014-04-22 DIAGNOSIS — I10 Essential (primary) hypertension: Secondary | ICD-10-CM

## 2014-04-22 DIAGNOSIS — I5042 Chronic combined systolic (congestive) and diastolic (congestive) heart failure: Secondary | ICD-10-CM | POA: Insufficient documentation

## 2014-04-22 DIAGNOSIS — I2510 Atherosclerotic heart disease of native coronary artery without angina pectoris: Secondary | ICD-10-CM

## 2014-04-22 NOTE — Assessment & Plan Note (Signed)
He reports improved shortness of breath, weight is stable on Lasix. Recent lab work reviewed above.

## 2014-04-22 NOTE — Patient Instructions (Signed)
Your physician recommends that you schedule a follow-up appointment in: 4 months.  Your physician recommends that you continue on your current medications as directed. Please refer to the Current Medication list given to you today.  

## 2014-04-22 NOTE — Progress Notes (Signed)
Reason for visit: CAD, cardiomyopathy  Clinical Summary [name] is a 80 y.o.male last seen in October. At that time Norvasc was increased for more optimal control of blood pressure. He was also on Lasix for management of combined systolic and diastolic heart failure. He presents today for a routine visit. States that he feels better, reports NYHA class II dyspnea, no chest pain. No cough, fevers or chills. He reports compliance with his medications. Reviewed home blood pressure checks, systolics ranging from the 140s to the 160s.  Recent lab work in November showed BUN 16, creatinine 0.9, potassium 3.9. He continues on Lasix. Weight is stable compared to October.  Echocardiogram done at [facility] in October reported mild LVH with LVEF 40-45%, distal anterior/anteroseptal hypokinesis to akinesis, diastolic dysfunction, mild to moderate mitral regurgitation, trace aortic regurgitation, mild tricuspid regurgitation with RVSP 40-50 mm mercury    No Known Allergies  Current Outpatient Prescriptions  Medication Sig Dispense Refill  . amLODipine (NORVASC) 5 MG tablet Take 1.5 tablets (7.5 mg total) by mouth daily. 45 tablet 3  . aspirin 325 MG tablet Take 162 mg by mouth daily.    . Dutasteride-Tamsulosin HCl (JALYN) 0.5-0.4 MG CAPS Take 1 tablet by mouth daily.    . furosemide (LASIX) 40 MG tablet Take 20 mg by mouth daily.    Marland Kitchen lisinopril (PRINIVIL,ZESTRIL) 20 MG tablet Take 20 mg by mouth daily.    . metoprolol succinate (TOPROL XL) 25 MG 24 hr tablet Take 1 tablet (25 mg total) by mouth daily. 90 tablet 3  . Multiple Vitamins-Minerals (CENTRUM SILVER PO) Take 1 tablet by mouth daily.    . nitroGLYCERIN (NITROSTAT) 0.4 MG SL tablet Place 0.4 mg under the tongue every 5 (five) minutes as needed for chest pain.    . simvastatin (ZOCOR) 10 MG tablet TAKE 1/2 A TABLET BY MOUTH EVERY DAY EVERY EVENING 15 tablet 6   No current facility-administered medications for this visit.     Past Medical History  Diagnosis Date  . Coronary atherosclerosis of native coronary artery   . Mixed hyperlipidemia   . Essential hypertension, benign   . PVD (peripheral vascular disease)   . Myocardial infarction     Remote NSTEMI  . Carotid artery disease     Mild    Past Surgical History  Procedure Laterality Date  . Femoral hernia repair    . Coronary artery bypass graft  07/2003     Dr. Roxy Manns: LIMA to LAD, SVG to diagonal and OM, SVG to PDA    Social History Mr. Seibold reports that he quit smoking about 35 years ago. His smoking use included Cigarettes and Pipe. He started smoking about 72 years ago. He has a 5 pack-year smoking history. He has never used smokeless tobacco. Mr. Nehme reports that he does not drink alcohol.  Review of Systems Complete review of systems negative except as otherwise outlined in the clinical summary.  Physical Examination Filed Vitals:   04/22/14 0833  BP: 142/62  Pulse: 57   Filed Weights   04/22/14 0833  Weight: 168 lb (76.204 kg)    Elderly male, appears comfortable at rest. HEENT: Conjunctiva and lids normal, oropharynx with moist mucosa.  Neck: Supple, no elevated JVP or bruits.  Lungs: Clear to auscultation, no murmur.  Cardiac: Regular rate and rhythm, soft systolic murmur at the base, preserved second heart sound, no S3  Abdomen: Soft, nontender, bowel sounds present.  Extremities: No pitting edema.  Skin: Warm  and dry. Musculoskeletal: No kyphosis. Neuropsychiatric: Alert and oriented x3, hard of hearing. Affect appropriate.   Problem List and Plan   CORONARY ATHEROSCLEROSIS NATIVE CORONARY ARTERY No active angina symptoms with stable dyspnea on exertion. Continue present medical regimen.  Essential hypertension, benign Blood pressure is normal today. Home blood pressure checks reviewed. For now continue current regimen.  Chronic combined systolic and diastolic congestive heart failure He reports  improved shortness of breath, weight is stable on Lasix. Recent lab work reviewed above.    Satira Sark, M.D., F.A.C.C.

## 2014-04-22 NOTE — Assessment & Plan Note (Signed)
Blood pressure is normal today. Home blood pressure checks reviewed. For now continue current regimen.

## 2014-04-22 NOTE — Assessment & Plan Note (Signed)
No active angina symptoms with stable dyspnea on exertion. Continue present medical regimen.

## 2014-07-05 ENCOUNTER — Other Ambulatory Visit: Payer: Self-pay | Admitting: Cardiology

## 2014-08-20 ENCOUNTER — Encounter: Payer: Self-pay | Admitting: Cardiology

## 2014-08-20 ENCOUNTER — Telehealth: Payer: Self-pay | Admitting: *Deleted

## 2014-08-20 ENCOUNTER — Ambulatory Visit (INDEPENDENT_AMBULATORY_CARE_PROVIDER_SITE_OTHER): Payer: Medicare Other | Admitting: Cardiology

## 2014-08-20 VITALS — BP 140/78 | HR 62 | Ht 70.0 in | Wt 174.0 lb

## 2014-08-20 DIAGNOSIS — I255 Ischemic cardiomyopathy: Secondary | ICD-10-CM

## 2014-08-20 DIAGNOSIS — I10 Essential (primary) hypertension: Secondary | ICD-10-CM | POA: Diagnosis not present

## 2014-08-20 DIAGNOSIS — I2510 Atherosclerotic heart disease of native coronary artery without angina pectoris: Secondary | ICD-10-CM | POA: Diagnosis not present

## 2014-08-20 MED ORDER — METOPROLOL SUCCINATE ER 25 MG PO TB24: 25.0000 mg | ORAL_TABLET | Freq: Every day | ORAL | Status: DC

## 2014-08-20 NOTE — Progress Notes (Signed)
Cardiology Office Note  Date: 08/20/2014   ID: ARACELI COUFAL, DOB 10-28-1917, MRN [id number]  PCP: Glenda Chroman., MD  Primary Cardiologist: Rozann Lesches, MD   Chief Complaint  Patient presents with  . Coronary Artery Disease  . Cardiomyopathy    History of Present Illness: MARCQUES WRIGHTSMAN is a 81 y.o. male last seen in November 2015. He presents for a routine visit today. 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 He reports NYHA class 2-3 dyspnea depending on level of activity, overall stable. No chest pain.  We reviewed his medications. He has run out of Toprol-XL, I spoke with nursing about making sure he has a refill to pickup today. Home blood pressure check was 140/80.  His weight is up compared to the last visit, but he does not endorse any orthopnea or PND. He continues on low-dose Lasix.   Past Medical History  Diagnosis Date  . Coronary atherosclerosis of native coronary artery   . Mixed hyperlipidemia   . Essential hypertension, benign   . PVD (peripheral vascular disease)   . Myocardial infarction     Remote NSTEMI  . Carotid artery disease     Mild  . Ischemic cardiomyopathy     LVEF 40-45%    Past Surgical History  Procedure Laterality Date  . Femoral hernia repair    . Coronary artery bypass graft  07/2003     Dr. Roxy Manns: LIMA to LAD, SVG to diagonal and OM, SVG to PDA    Current Outpatient Prescriptions  Medication Sig Dispense Refill  . amLODipine (NORVASC) 5 MG tablet TAKE 1 AND 1/2 TABLET ONCE A DAY 45 tablet 3  . aspirin 325 MG tablet Take 162 mg by mouth daily.    . Dutasteride-Tamsulosin HCl (JALYN) 0.5-0.4 MG CAPS Take 1 tablet by mouth daily.    . furosemide (LASIX) 40 MG tablet Take 20 mg by mouth daily.    Marland Kitchen lisinopril (PRINIVIL,ZESTRIL) 20 MG tablet Take 20 mg by mouth daily.    . metoprolol succinate (TOPROL XL) 25 MG 24 hr tablet Take  1 tablet (25 mg total) by mouth daily. 90 tablet 3  . Multiple Vitamins-Minerals (CENTRUM SILVER PO) Take 1 tablet by mouth daily.    . nitroGLYCERIN (NITROSTAT) 0.4 MG SL tablet Place 0.4 mg under the tongue every 5 (five) minutes as needed for chest pain.    . simvastatin (ZOCOR) 10 MG tablet TAKE 1/2 A TABLET BY MOUTH EVERY DAY EVERY EVENING 15 tablet 6   No current facility-administered medications for this visit.    Allergies:  Review of patient's allergies indicates no known allergies.   Social History: The patient  reports that he quit smoking about 36 years ago. His smoking use included Cigarettes and Pipe. He started smoking about 72 years ago. He has a 5 pack-year smoking history. He has never used smokeless tobacco. He reports that he does not drink alcohol or use illicit drugs.    ROS:  Please see the history of present illness. Otherwise, complete review of systems is positive for decreased hearing.  All other systems are reviewed and negative.   Physical Exam: VS:  BP 140/78 mmHg  Pulse 62  Ht 5\' 10"  (1.778 m)  Wt 174 lb (78.926 kg)  BMI 24.97 kg/m2  SpO2 92%, BMI Body mass index is 24.97 kg/(m^2).  Wt Readings from Last 3 Encounters:  08/20/14 174 lb (78.926 kg)  04/22/14 168 lb (76.204 kg)  03/11/14 167 lb (75.751 kg)     Elderly male, appears comfortable at rest. HEENT: Conjunctiva and lids normal, oropharynx clear.  Neck: Supple, no elevated JVP or bruits.  Lungs: Clear to auscultation, no murmur.  Cardiac: Regular rate and rhythm, soft systolic murmur at the base, preserved second heart sound, no S3  Abdomen: Soft, nontender, bowel sounds present.  Extremities: Trace ankle edema.  Skin: Warm and dry. Musculoskeletal: No kyphosis. Neuropsychiatric: Alert and oriented x3, hard of hearing. Affect appropriate.   ECG: ECG is not ordered today.   Recent Labwork:  04/21/2014 BUN 16, creatinine 0.9, potassium 3.9.  Other Studies Reviewed  Today:  Echocardiogram done at [facility] in October 2015 reported mild LVH with LVEF 40-45%, distal anterior/anteroseptal hypokinesis to akinesis, diastolic dysfunction, mild to moderate mitral regurgitation, trace aortic regurgitation, mild tricuspid regurgitation with RVSP 40-50 mm mercury  Assessment and Plan:  1. Ischemic cardiomyopathy, no active angina symptoms and overall stable dyspnea on exertion. He remains active and reports no major functional decline. Plan is to continue medical therapy, refill provided for Toprol-XL.  2. Multivessel CAD status post CABG. LVEF 40-45% range.  3. Essential hypertension, no changes made to current regimen.  Current medicines were reviewed with the patient today.  Disposition: FU with me in 4 months.   Signed, Satira Sark, MD, [facility] 08/20/2014 9:10 AM    [facility] at [place], [place], [place] [address] Phone: [phone]; Fax: [phone]

## 2014-08-20 NOTE — Telephone Encounter (Signed)
Refill request for metoprolol 25 mg from [facility]. Medication sent to pharmacy.

## 2014-08-20 NOTE — Patient Instructions (Signed)
Continue all current medications. Follow up in  4 months

## 2014-10-03 ENCOUNTER — Other Ambulatory Visit: Payer: Self-pay | Admitting: Cardiology

## 2014-12-24 ENCOUNTER — Other Ambulatory Visit: Payer: Self-pay

## 2014-12-24 MED ORDER — SIMVASTATIN 10 MG PO TABS: 5.0000 mg | ORAL_TABLET | Freq: Every evening | ORAL | Status: DC

## 2015-01-01 ENCOUNTER — Encounter: Payer: Self-pay | Admitting: Cardiology

## 2015-01-01 ENCOUNTER — Ambulatory Visit (INDEPENDENT_AMBULATORY_CARE_PROVIDER_SITE_OTHER): Payer: Medicare Other | Admitting: Cardiology

## 2015-01-01 VITALS — BP 144/69 | HR 60 | Ht 70.0 in | Wt 168.0 lb

## 2015-01-01 DIAGNOSIS — I2510 Atherosclerotic heart disease of native coronary artery without angina pectoris: Secondary | ICD-10-CM | POA: Diagnosis not present

## 2015-01-01 DIAGNOSIS — I10 Essential (primary) hypertension: Secondary | ICD-10-CM | POA: Diagnosis not present

## 2015-01-01 DIAGNOSIS — I255 Ischemic cardiomyopathy: Secondary | ICD-10-CM

## 2015-01-01 NOTE — Progress Notes (Signed)
Cardiology Office Note  Date: 01/01/2015   ID: [name], DOB [birth year], MRN [id number]  PCP: Jordan Chroman., MD  Primary Cardiologist: Jordan Lesches, MD   Chief Complaint  Patient presents with  . Coronary Artery Disease  . Cardiomyopathy    History of Present Illness: [name] is a 81 y.o. male last seen in March. He comes in today for a routine visit, continues to do very well. 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 He reports compliance with his medications, has had no interval hospitalizations. He will be turning 89 in about a week.  Weight is down about 6 pounds compared to March. He reports no orthopnea or PND. No palpitations or syncope.   Past Medical History  Diagnosis Date  . Coronary atherosclerosis of native coronary artery   . Mixed hyperlipidemia   . Essential hypertension, benign   . PVD (peripheral vascular disease)   . Myocardial infarction     Remote NSTEMI  . Carotid artery disease     Mild  . Ischemic cardiomyopathy     LVEF 40-45%    Past Surgical History  Procedure Laterality Date  . Femoral hernia repair    . Coronary artery bypass graft  07/2003     Dr. Roxy Manns: LIMA to LAD, SVG to diagonal and OM, SVG to PDA    Current Outpatient Prescriptions  Medication Sig Dispense Refill  . amLODipine (NORVASC) 5 MG tablet TAKE 1 AND 1/2 TABLET BY MOUTH ONCE A DAY 45 tablet 3  . aspirin 325 MG tablet Take 162 mg by mouth daily.    . Dutasteride-Tamsulosin HCl (JALYN) 0.5-0.4 MG CAPS Take 1 tablet by mouth daily.    . furosemide (LASIX) 40 MG tablet Take 20 mg by mouth daily.    Marland Kitchen lisinopril (PRINIVIL,ZESTRIL) 20 MG tablet Take 20 mg by mouth daily.    . metoprolol succinate (TOPROL XL) 25 MG 24 hr tablet Take 1 tablet (25 mg total) by mouth daily. 90 tablet 3  . Multiple Vitamins-Minerals (CENTRUM SILVER PO) Take 1 tablet by mouth daily.    . nitroGLYCERIN (NITROSTAT) 0.4 MG SL tablet Place 0.4  mg under the tongue every 5 (five) minutes as needed for chest pain.    . simvastatin (ZOCOR) 10 MG tablet Take 0.5 tablets (5 mg total) by mouth every evening. 15 tablet 6   No current facility-administered medications for this visit.    Allergies:  Review of patient's allergies indicates no known allergies.   Social History: The patient  reports that he quit smoking about 36 years ago. His smoking use included Cigarettes and Pipe. He started smoking about 73 years ago. He has a 5 pack-year smoking history. He has never used smokeless tobacco. He reports that he does not drink alcohol or use illicit drugs.   ROS:  Please see the history of present illness. Otherwise, complete review of systems is positive for decreased hearing.  All other systems are reviewed and negative.   Physical Exam: VS:  BP 144/69 mmHg  Pulse 60  Ht '5\' 10"'$  (1.778 m)  Wt 168 lb (76.204 kg)  BMI 24.11 kg/m2  SpO2 95%, BMI Body mass index is 24.11 kg/(m^2).  Wt Readings from Last 3 Encounters:  01/01/15 168 lb (76.204 kg)  08/20/14 174 lb (78.926 kg)  04/22/14 168 lb (76.204 kg)     Elderly male, appears comfortable at rest. HEENT: Conjunctiva and lids normal,  oropharynx clear.  Neck: Supple, no elevated JVP or bruits.  Lungs: Clear to auscultation, no murmur.  Cardiac: Regular rate and rhythm, soft systolic murmur at the base, preserved second heart sound, no S3  Abdomen: Soft, nontender, bowel sounds present.  Extremities: Trace ankle edema.   ECG: ECG is not ordered today.  Other Studies Reviewed Today:  Echocardiogram done at [facility] in October 2015 reported mild LVH with LVEF 40-45%, distal anterior/anteroseptal hypokinesis to akinesis, diastolic dysfunction, mild to moderate mitral regurgitation, trace aortic regurgitation, mild tricuspid regurgitation with RVSP 40-50 mm mercury  Assessment and Plan:  1. Symptomatically stable CAD status post CABG on medical therapy. Continue  observation for now.  2. Ischemic cardiomyopathy with LVEF 40-45%, appears euvolemic today.  3. Hypertension, no change in current regimen. Keep follow-up with [name].  Current medicines were reviewed with the patient today.  Disposition: FU with me in 6 months.   Signed, Satira Sark, MD, [facility] 01/01/2015 4:28 PM    [facility] at [place], [place], [place] [address] Phone: [phone]; Fax: [phone]

## 2015-01-01 NOTE — Patient Instructions (Signed)
Your physician recommends that you continue on your current medications as directed. Please refer to the Current Medication list given to you today. Your physician recommends that you schedule a follow-up appointment in: 6 months. You will receive a reminder letter in the mail in about 4 months reminding you to call and schedule your appointment. If you don't receive this letter, please contact our office. 

## 2015-03-05 ENCOUNTER — Other Ambulatory Visit: Payer: Self-pay | Admitting: Cardiology

## 2015-06-29 ENCOUNTER — Other Ambulatory Visit: Payer: Self-pay | Admitting: Cardiology

## 2015-08-06 ENCOUNTER — Other Ambulatory Visit: Payer: Self-pay | Admitting: *Deleted

## 2015-08-06 MED ORDER — METOPROLOL SUCCINATE ER 25 MG PO TB24: 25.0000 mg | ORAL_TABLET | Freq: Every day | ORAL | Status: DC

## 2015-10-06 ENCOUNTER — Ambulatory Visit (INDEPENDENT_AMBULATORY_CARE_PROVIDER_SITE_OTHER): Payer: Medicare Other | Admitting: Cardiology

## 2015-10-06 ENCOUNTER — Encounter: Payer: Self-pay | Admitting: Cardiology

## 2015-10-06 VITALS — BP 112/62 | HR 64 | Ht 71.0 in | Wt 168.0 lb

## 2015-10-06 DIAGNOSIS — I255 Ischemic cardiomyopathy: Secondary | ICD-10-CM

## 2015-10-06 DIAGNOSIS — I10 Essential (primary) hypertension: Secondary | ICD-10-CM

## 2015-10-06 DIAGNOSIS — I2510 Atherosclerotic heart disease of native coronary artery without angina pectoris: Secondary | ICD-10-CM

## 2015-10-06 NOTE — Progress Notes (Signed)
Cardiology Office Note  Date: 10/06/2015   ID: [name], DOB 11-19-1917, MRN [id number]  PCP: Glenda Chroman, MD  Primary Cardiologist: Rozann Lesches, MD   Chief Complaint  Patient presents with  . Coronary Artery Disease  . Cardiomyopathy    History of Present Illness: [name] is a 82 y.o. male last seen in August 2016. He presents today for a routine visit. 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 He has planted a garden and was working on his tractor earlier this morning. He does not report any angina symptoms. Has occasional left arm discomfort, but does not have to use nitroglycerin.  I reviewed his medications which are outlined below and stable from a cardiac perspective. ECG today shows sinus rhythm with prolonged PR interval, previous anterior infarct pattern, and intermittent junctional beats.  Past Medical History  Diagnosis Date  . Coronary atherosclerosis of native coronary artery   . Mixed hyperlipidemia   . Essential hypertension, benign   . PVD (peripheral vascular disease) ([place])   . Myocardial infarction [facility])     Remote NSTEMI  . Carotid artery disease (HCC)     Mild  . Ischemic cardiomyopathy     LVEF 40-45%    Past Surgical History  Procedure Laterality Date  . Femoral hernia repair    . Coronary artery bypass graft  07/2003     Dr. Roxy Manns: LIMA to LAD, SVG to diagonal and OM, SVG to PDA    Current Outpatient Prescriptions  Medication Sig Dispense Refill  . amLODipine (NORVASC) 5 MG tablet TAKE 1 AND 1/2 TABLET BY MOUTH ONCE A DAY 45 tablet 3  . aspirin 325 MG tablet Take 162 mg by mouth daily.    . Dutasteride-Tamsulosin HCl (JALYN) 0.5-0.4 MG CAPS Take 1 tablet by mouth daily.    . furosemide (LASIX) 40 MG tablet Take 20 mg by mouth daily.    Marland Kitchen lisinopril (PRINIVIL,ZESTRIL) 20 MG tablet Take 20 mg by mouth daily.    . metoprolol succinate (TOPROL XL) 25 MG 24 hr tablet Take 1  tablet (25 mg total) by mouth daily. 90 tablet 0  . Multiple Vitamins-Minerals (CENTRUM SILVER PO) Take 1 tablet by mouth daily.    . nitroGLYCERIN (NITROSTAT) 0.4 MG SL tablet Place 0.4 mg under the tongue every 5 (five) minutes as needed for chest pain.    . simvastatin (ZOCOR) 10 MG tablet Take 0.5 tablets (5 mg total) by mouth every evening. 15 tablet 6   No current facility-administered medications for this visit.   Allergies:  Review of patient's allergies indicates no known allergies.   Social History: The patient  reports that he quit smoking about 37 years ago. His smoking use included Cigarettes and Pipe. He started smoking about 73 years ago. He has a 5 pack-year smoking history. He has never used smokeless tobacco. He reports that he does not drink alcohol or use illicit drugs.   ROS:  Please see the history of present illness. Otherwise, complete review of systems is positive for decreased hearing, chronic arthritic pains.  All other systems are reviewed and negative.   Physical Exam: VS:  BP 112/62 mmHg  Pulse 64  Ht '5\' 11"'$  (1.803 m)  Wt 168 lb (76.204 kg)  BMI 23.44 kg/m2  SpO2 90%, BMI Body mass index is 23.44 kg/(m^2).  Wt Readings from Last 3 Encounters:  10/06/15 168 lb (76.204 kg)  01/01/15 168  lb (76.204 kg)  08/20/14 174 lb (78.926 kg)    Elderly male, appears comfortable at rest. HEENT: Conjunctiva and lids normal, oropharynx clear.  Neck: Supple, no elevated JVP or bruits.  Lungs: Clear to auscultation, no murmur.  Cardiac: Regular rate and rhythm, soft systolic murmur at the base, preserved second heart sound, no S3  Abdomen: Soft, nontender, bowel sounds present.  Extremities: Trace ankle edema.   ECG: I personally reviewed the prior tracing from 02/19/2014 which showed sinus rhythm with prolonged PR interval, left atrial enlargement, leftward axis.  Other Studies Reviewed Today:  Echocardiogram [facility]) October 2015: Mild LVH  with LVEF 40-45%, distal anterior/anteroseptal hypokinesis to akinesis, diastolic dysfunction, mild to moderate mitral regurgitation, trace aortic regurgitation, mild tricuspid regurgitation with RVSP 40-50 mm mercury.  Assessment and Plan:  1. Multivessel CAD status post CABG, stable in terms of angina control on medical therapy. He will continue observation.  2. Sinus rhythm with intermittent junctional beats and prolonged PR interval. He has tolerated Toprol-XL with no major functional decline. Watch for any symptomatic bradycardia or conduction system disease.  3. Ischemic cardiomyopathy with LVEF 40-45%. Continue conservative management. No progressive heart failure symptoms.  Current medicines were reviewed with the patient today.   Orders Placed This Encounter  Procedures  . EKG 12-Lead    Disposition: FU with me in 6 months.   Signed, Satira Sark, MD, [facility] 10/06/2015 3:16 PM    [facility] at [place], [place], [place] [address] Phone: [phone]; Fax: [phone]

## 2015-10-06 NOTE — Patient Instructions (Signed)
Your physician recommends that you continue on your current medications as directed. Please refer to the Current Medication list given to you today. Your physician recommends that you schedule a follow-up appointment in: 6 months. You will receive a reminder letter in the mail in about 4 months reminding you to call and schedule your appointment. If you don't receive this letter, please contact our office. 

## 2015-10-31 ENCOUNTER — Other Ambulatory Visit: Payer: Self-pay | Admitting: Cardiology

## 2015-11-06 ENCOUNTER — Other Ambulatory Visit: Payer: Self-pay | Admitting: *Deleted

## 2015-11-06 MED ORDER — METOPROLOL SUCCINATE ER 25 MG PO TB24: 25.0000 mg | ORAL_TABLET | Freq: Every day | ORAL | Status: DC

## 2015-11-21 ENCOUNTER — Other Ambulatory Visit: Payer: Self-pay | Admitting: Cardiology

## 2016-04-04 NOTE — Progress Notes (Signed)
Cardiology Office Note  Date: 04/05/2016   ID: [name], DOB 1918/03/11, MRN [id number]  PCP: Glenda Chroman, MD  Primary Cardiologist: Rozann Lesches, MD   Chief Complaint  Patient presents with  . Coronary Artery Disease    History of Present Illness: [name] is a 82 y.o. male last seen in May. He presents today for a routine follow-up visit. No angina symptoms or major change in his functional capacity. His wife continues to dwindle with poor health, he has to assist feeding her, cooking for her and taking care of the house. He remains determined and upbeat, although realizes that she is getting worse.  We continue medical therapy and conservative management of his ischemic cardiomyopathy and CAD. Current medicines include aspirin, Norvasc, Lasix, lisinopril, Toprol-XL, Zocor, and as needed nitroglycerin.  Today we spent some time talking about his service during World War II. 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. He landed in [place] in [place], worked his way into [place] and was there for at least a year after the war.  Past Medical History:  Diagnosis Date  . Carotid artery disease (HCC)    Mild  . Coronary atherosclerosis of native coronary artery   . Essential hypertension, benign   . Ischemic cardiomyopathy    LVEF 40-45%  . Mixed hyperlipidemia   . Myocardial infarction    Remote NSTEMI  . PVD (peripheral vascular disease) ([place])     Past Surgical History:  Procedure Laterality Date  . CORONARY ARTERY BYPASS GRAFT  07/2003    Dr. Roxy Manns: LIMA to LAD, SVG to diagonal and OM, SVG to PDA  . FEMORAL HERNIA REPAIR      Current Outpatient Prescriptions  Medication Sig Dispense Refill  . amLODipine (NORVASC) 5 MG tablet TAKE 1 AND 1/2 TABLET BY MOUTH ONCE A DAY 45 tablet 6  . aspirin 325 MG tablet Take 162 mg by mouth daily.    . Dutasteride-Tamsulosin HCl (JALYN) 0.5-0.4  MG CAPS Take 1 tablet by mouth daily.    . furosemide (LASIX) 40 MG tablet Take 20 mg by mouth daily.    Marland Kitchen lisinopril (PRINIVIL,ZESTRIL) 20 MG tablet Take 20 mg by mouth daily.    . metoprolol succinate (TOPROL XL) 25 MG 24 hr tablet Take 1 tablet (25 mg total) by mouth daily. 90 tablet 3  . Multiple Vitamins-Minerals (CENTRUM SILVER PO) Take 1 tablet by mouth daily.    . nitroGLYCERIN (NITROSTAT) 0.4 MG SL tablet Place 0.4 mg under the tongue every 5 (five) minutes as needed for chest pain.    . simvastatin (ZOCOR) 10 MG tablet TAKE 0.5 TABLETS (5 MG TOTAL) BY MOUTH EVERY EVENING. 15 tablet 6   No current facility-administered medications for this visit.    Allergies:  Patient has no known allergies.   Social History: The patient  reports that he quit smoking about 37 years ago. His smoking use included Cigarettes and Pipe. He started smoking about 74 years ago. He has a 5.00 pack-year smoking history. He has never used smokeless tobacco. He reports that he does not drink alcohol or use drugs.   ROS:  Please see the history of present illness. Otherwise, complete review of systems is positive for diminished hearing.  All other systems are reviewed and negative.   Physical Exam: VS:  BP 135/69   Pulse 87   Ht '5\' 11"'$  (1.803 m)  Wt 161 lb (73 kg)   SpO2 93%   BMI 22.45 kg/m , BMI Body mass index is 22.45 kg/m.  Wt Readings from Last 3 Encounters:  04/05/16 161 lb (73 kg)  10/06/15 168 lb (76.2 kg)  01/01/15 168 lb (76.2 kg)    Elderly male, appears comfortable at rest. HEENT: Conjunctiva and lids normal, oropharynx clear.  Neck: Supple, no elevated JVP or bruits.  Lungs: Clear to auscultation, no murmur.  Cardiac: Regular rate and rhythm, soft systolic murmur at the base, preserved second heart sound, no S3  Abdomen: Soft, nontender, bowel sounds present.  Extremities: Trace ankle edema.   ECG: I personally reviewed the tracing from 10/06/2015 which showed sinus bradycardia  with prolonged PR interval and intermittent junctional beats, old anterior infarct pattern, leftward axis.  Other Studies Reviewed Today:  Echocardiogram [facility]) October 2015: Mild LVH with LVEF 40-45%, distal anterior/anteroseptal hypokinesis to akinesis, diastolic dysfunction, mild to moderate mitral regurgitation, trace aortic regurgitation, mild tricuspid regurgitation with RVSP 40-50 mm mercury.  Assessment and Plan:  1. Multivessel CAD status post CABG in 2005. He continues to do very well on medical therapy, and we have held off on ischemic testing in light of no significant angina symptoms.  2. Ischemic cardiomyopathy with LVEF 40-45% range over time. He does not have any evidence of volume overload, continues to tolerate medical therapy well. No clear indication for follow-up echocardiogram at this time.  3. Mixed hyperlipidemia, continues on Zocor with follow-up per Dr. Woody Seller.  4. Essential hypertension, blood pressure is adequately controlled today.  Current medicines were reviewed with the patient today.  Disposition: Follow-up in 6 months.  Signed, Satira Sark, MD, [facility] 04/05/2016 12:08 PM    [facility] at [place], [place], [place] [address] Phone: [phone]; Fax: [phone]

## 2016-04-05 ENCOUNTER — Encounter: Payer: Self-pay | Admitting: Cardiology

## 2016-04-05 ENCOUNTER — Ambulatory Visit (INDEPENDENT_AMBULATORY_CARE_PROVIDER_SITE_OTHER): Payer: Medicare Other | Admitting: Cardiology

## 2016-04-05 VITALS — BP 135/69 | HR 87 | Ht 71.0 in | Wt 161.0 lb

## 2016-04-05 DIAGNOSIS — E782 Mixed hyperlipidemia: Secondary | ICD-10-CM

## 2016-04-05 DIAGNOSIS — I10 Essential (primary) hypertension: Secondary | ICD-10-CM

## 2016-04-05 DIAGNOSIS — I2510 Atherosclerotic heart disease of native coronary artery without angina pectoris: Secondary | ICD-10-CM | POA: Diagnosis not present

## 2016-04-05 DIAGNOSIS — I255 Ischemic cardiomyopathy: Secondary | ICD-10-CM

## 2016-04-05 NOTE — Patient Instructions (Signed)

## 2016-05-12 ENCOUNTER — Other Ambulatory Visit: Payer: Self-pay | Admitting: Cardiology

## 2016-06-12 ENCOUNTER — Other Ambulatory Visit: Payer: Self-pay | Admitting: Cardiology

## 2016-08-02 ENCOUNTER — Emergency Department (HOSPITAL_COMMUNITY)
Admission: EM | Admit: 2016-08-02 | Discharge: 2016-08-02 | Disposition: A | Discharge status: Home / Self Care | Payer: Medicare Other | Attending: Emergency Medicine | Admitting: Emergency Medicine

## 2016-08-02 ENCOUNTER — Encounter (HOSPITAL_COMMUNITY): Payer: Self-pay | Admitting: *Deleted

## 2016-08-02 ENCOUNTER — Emergency Department (HOSPITAL_COMMUNITY): Payer: Medicare Other

## 2016-08-02 DIAGNOSIS — I2510 Atherosclerotic heart disease of native coronary artery without angina pectoris: Secondary | ICD-10-CM | POA: Diagnosis not present

## 2016-08-02 DIAGNOSIS — S8992XA Unspecified injury of left lower leg, initial encounter: Secondary | ICD-10-CM | POA: Diagnosis present

## 2016-08-02 DIAGNOSIS — W1839XA Other fall on same level, initial encounter: Secondary | ICD-10-CM | POA: Diagnosis not present

## 2016-08-02 DIAGNOSIS — I110 Hypertensive heart disease with heart failure: Secondary | ICD-10-CM | POA: Insufficient documentation

## 2016-08-02 DIAGNOSIS — W19XXXA Unspecified fall, initial encounter: Secondary | ICD-10-CM

## 2016-08-02 DIAGNOSIS — Z87891 Personal history of nicotine dependence: Secondary | ICD-10-CM | POA: Insufficient documentation

## 2016-08-02 DIAGNOSIS — Y999 Unspecified external cause status: Secondary | ICD-10-CM | POA: Diagnosis not present

## 2016-08-02 DIAGNOSIS — Z79899 Other long term (current) drug therapy: Secondary | ICD-10-CM | POA: Insufficient documentation

## 2016-08-02 DIAGNOSIS — Y939 Activity, unspecified: Secondary | ICD-10-CM | POA: Insufficient documentation

## 2016-08-02 DIAGNOSIS — R002 Palpitations: Secondary | ICD-10-CM | POA: Insufficient documentation

## 2016-08-02 DIAGNOSIS — I5042 Chronic combined systolic (congestive) and diastolic (congestive) heart failure: Secondary | ICD-10-CM | POA: Diagnosis not present

## 2016-08-02 DIAGNOSIS — M25562 Pain in left knee: Secondary | ICD-10-CM | POA: Diagnosis not present

## 2016-08-02 DIAGNOSIS — Y929 Unspecified place or not applicable: Secondary | ICD-10-CM | POA: Insufficient documentation

## 2016-08-02 DIAGNOSIS — Z7982 Long term (current) use of aspirin: Secondary | ICD-10-CM | POA: Diagnosis not present

## 2016-08-02 DIAGNOSIS — Z955 Presence of coronary angioplasty implant and graft: Secondary | ICD-10-CM | POA: Insufficient documentation

## 2016-08-02 LAB — BASIC METABOLIC PANEL
ANION GAP: 9 (ref 5–15)
BUN: 20 mg/dL (ref 6–20)
CALCIUM: 9.3 mg/dL (ref 8.9–10.3)
CO2: 27 mmol/L (ref 22–32)
Chloride: 106 mmol/L (ref 101–111)
Creatinine, Ser: 1.19 mg/dL (ref 0.61–1.24)
GFR, EST AFRICAN AMERICAN: 58 mL/min — AB (ref >60)
GFR, EST NON AFRICAN AMERICAN: 50 mL/min — AB (ref >60)
GLUCOSE: 118 mg/dL — AB (ref 65–99)
Potassium: 3.9 mmol/L (ref 3.5–5.1)
SODIUM: 142 mmol/L (ref 135–145)

## 2016-08-02 LAB — CBC
HCT: 41.6 % (ref 39.0–52.0)
Hemoglobin: 13.3 g/dL (ref 13.0–17.0)
MCH: 28.2 pg (ref 26.0–34.0)
MCHC: 32 g/dL (ref 30.0–36.0)
MCV: 88.3 fL (ref 78.0–100.0)
PLATELETS: 354 10*3/uL (ref 150–400)
RBC: 4.71 MIL/uL (ref 4.22–5.81)
RDW: 14.4 % (ref 11.5–15.5)
WBC: 12.2 10*3/uL — AB (ref 4.0–10.5)

## 2016-08-02 NOTE — ED Notes (Addendum)
Patient ambulated in hall. O2 dropped to 88% while ambulating without O2. With O2 via [place] at 2L patient maintains 94%.

## 2016-08-02 NOTE — ED Provider Notes (Signed)
[facility] DEPT Provider Note   CSN: [id number] Arrival date & time: 08/02/16  2458   By signing my name below, I, Evelene Croon, attest that this documentation has been prepared under the direction and in the presence of Jola Schmidt, MD . Electronically Signed: Evelene Croon, Scribe. 08/02/2016. 10:32 AM.  History   Chief Complaint Chief Complaint  Patient presents with  . Tachycardia    The history is provided by the patient, a caregiver and a relative. No language interpreter was used.     HPI Comments:  STARK AGUINAGA is a 83 y.o. male with a history of CAD, MI, PVD, and HLD, who presents to the Emergency Department complaining of palpitations today. Pt's nurse reports BP of 140/60 and an irregular heartbeat this morning. She also states pt appears pale and his brother stated to her that he "just didn't look right". Pt reports fall yesterday; he was not able to fully lift up the LLE while walking and fell. He landed on the left knee but was able to stand on his own afterward. He denies pain but states has been "hobbling" on the LLE and notes some difficulty adequately raising up the LLE. Pt states he has felt fine otherwise. He has no other physical complaints or symptoms at this time.  Past Medical History:  Diagnosis Date  . Carotid artery disease (HCC)    Mild  . Coronary atherosclerosis of native coronary artery   . Essential hypertension, benign   . Ischemic cardiomyopathy    LVEF 40-45%  . Mixed hyperlipidemia   . Myocardial infarction    Remote NSTEMI  . PVD (peripheral vascular disease) [facility])     Patient Active Problem List   Diagnosis Date Noted  . Chronic combined systolic and diastolic congestive heart failure ([place]) 04/22/2014  . Shortness of breath 03/11/2014  . Mixed hyperlipidemia 12/10/2009  . Essential hypertension, benign 12/10/2009  . CORONARY ATHEROSCLEROSIS NATIVE CORONARY ARTERY 12/10/2009    Past Surgical History:  Procedure Laterality Date   . CORONARY ARTERY BYPASS GRAFT  07/2003    Dr. Roxy Manns: LIMA to LAD, SVG to diagonal and OM, SVG to PDA  . FEMORAL HERNIA REPAIR         Home Medications    Prior to Admission medications   Medication Sig Start Date End Date Taking? Authorizing Provider  amLODipine (NORVASC) 5 MG tablet TAKE 1 AND 1/2 TABLET BY MOUTH ONCE A DAY 05/12/16   Satira Sark, MD  aspirin 325 MG tablet Take 162 mg by mouth daily.    Historical Provider, MD  Dutasteride-Tamsulosin HCl (JALYN) 0.5-0.4 MG CAPS Take 1 tablet by mouth daily.    Historical Provider, MD  furosemide (LASIX) 40 MG tablet Take 20 mg by mouth daily.    Historical Provider, MD  lisinopril (PRINIVIL,ZESTRIL) 20 MG tablet Take 20 mg by mouth daily.    Historical Provider, MD  metoprolol succinate (TOPROL XL) 25 MG 24 hr tablet Take 1 tablet (25 mg total) by mouth daily. 11/06/15 11/05/16  Satira Sark, MD  Multiple Vitamins-Minerals (CENTRUM SILVER PO) Take 1 tablet by mouth daily.    Historical Provider, MD  nitroGLYCERIN (NITROSTAT) 0.4 MG SL tablet Place 0.4 mg under the tongue every 5 (five) minutes as needed for chest pain.    Satira Sark, MD  simvastatin (ZOCOR) 10 MG tablet TAKE 0.5 TABLETS (5 MG TOTAL) BY MOUTH EVERY EVENING. 06/13/16   Satira Sark, MD    Family History Family History  Problem Relation Age of Onset  . Coronary artery disease      Family h/o    Social History Social History  Substance Use Topics  . Smoking status: Former Smoker    Packs/day: 0.50    Years: 10.00    Types: Cigarettes, Pipe    Start date: 01/15/1942    Quit date: 05/30/1978  . Smokeless tobacco: Never Used     Comment: smoked a pipe the first 8 years then just cigarettes   . Alcohol use No     Allergies   Patient has no known allergies.   Review of Systems Review of Systems  Constitutional: Negative for chills and fever.  Respiratory: Negative for shortness of breath.   Cardiovascular: Positive for palpitations.  Negative for chest pain.  All other systems reviewed and are negative.    Physical Exam Updated Vital Signs BP 147/69 (BP Location: Left Arm)   Pulse (!) 51   Temp 98.1 F (36.7 C) (Oral)   Resp 15   Ht '5\' 11"'$  (1.803 m)   Wt 161 lb (73 kg)   SpO2 95%   BMI 22.45 kg/m   Physical Exam  Constitutional: He is oriented to person, place, and time. He appears well-developed and well-nourished.  HENT:  Head: Normocephalic and atraumatic.  Eyes: EOM are normal.  Neck: Normal range of motion.  Cardiovascular: Normal rate, regular rhythm, normal heart sounds and intact distal pulses.   Pulmonary/Chest: Effort normal and breath sounds normal. No respiratory distress.  Abdominal: Soft. He exhibits no distension. There is no tenderness.  Musculoskeletal: Normal range of motion.  Neurological: He is alert and oriented to person, place, and time.  Skin: Skin is warm and dry.  Psychiatric: He has a normal mood and affect. Judgment normal.  Nursing note and vitals reviewed.    ED Treatments / Results  DIAGNOSTIC STUDIES:  Oxygen Saturation is 95% on RA, adequate by my interpretation.    COORDINATION OF CARE:  10:30 AM Discussed treatment plan with pt at bedside and pt agreed to plan.  Labs (all labs ordered are listed, but only abnormal results are displayed) Labs Reviewed  CBC - Abnormal; Notable for the following:       Result Value   WBC 12.2 (*)    All other components within normal limits  BASIC METABOLIC PANEL - Abnormal; Notable for the following:    Glucose, Bld 118 (*)    GFR calc non Af Amer 50 (*)    GFR calc Af Amer 58 (*)    All other components within normal limits    EKG  EKG Interpretation None       Radiology Dg Knee Complete 4 Views Left  Result Date: 08/02/2016 CLINICAL DATA:  [place] yesterday landing on the left knee EXAM: LEFT KNEE - COMPLETE 4+ VIEW COMPARISON:  None. FINDINGS: For age, the joint spaces are very well preserved with very minimal  degenerative change. No acute fracture is seen. No joint effusion is noted. Considerable arterial calcification is present involving the superficial femoral artery and tibioperoneal trunk. IMPRESSION: 1. No fracture.  No joint effusion. 2. Well preserved joint spaces for age. 3. Arterial calcifications consistent with atherosclerosis. Electronically Signed   By: Ivar Drape M.D.   On: 08/02/2016 11:06    Procedures Procedures (including critical care time)  Medications Ordered in ED Medications - No data to display   Initial Impression / Assessment and Plan / ED Course  I have reviewed the triage vital  signs and the nursing notes.  Pertinent labs & imaging results that were available during my care of the patient were reviewed by me and considered in my medical decision making (see chart for details).    Patient is overall well-appearing.  X-ray of his left knee is normal.  EKG appears to be sinus with PACs.  Patient without significant complaints.  No indication for additional workup.  Discharge home in good condition.  Primary care follow-up.  Final Clinical Impressions(s) / ED Diagnoses   Final diagnoses:  None    New Prescriptions New Prescriptions   No medications on file   I personally performed the services described in this documentation, which was scribed in my presence. The recorded information has been reviewed and is accurate.        Jola Schmidt, MD 08/02/16 1258

## 2016-08-02 NOTE — ED Triage Notes (Signed)
Pt comes in with daughter in law who states patients heart was irregular this morning. Denies any hx of a-fib. Pt denies any cp. Pt alert and oriented.

## 2016-09-01 ENCOUNTER — Emergency Department (HOSPITAL_COMMUNITY): Payer: Medicare Other

## 2016-09-01 ENCOUNTER — Encounter (HOSPITAL_COMMUNITY): Payer: Self-pay | Admitting: Emergency Medicine

## 2016-09-01 ENCOUNTER — Inpatient Hospital Stay (HOSPITAL_COMMUNITY)
Admission: EM | Admit: 2016-09-01 | Discharge: 2016-09-03 | DRG: 194 | Disposition: A | Discharge status: Home / Self Care | Payer: Medicare Other | Attending: Internal Medicine | Admitting: Internal Medicine

## 2016-09-01 DIAGNOSIS — J441 Chronic obstructive pulmonary disease with (acute) exacerbation: Secondary | ICD-10-CM | POA: Diagnosis not present

## 2016-09-01 DIAGNOSIS — J440 Chronic obstructive pulmonary disease with acute lower respiratory infection: Secondary | ICD-10-CM | POA: Diagnosis present

## 2016-09-01 DIAGNOSIS — J189 Pneumonia, unspecified organism: Secondary | ICD-10-CM | POA: Diagnosis not present

## 2016-09-01 DIAGNOSIS — R0902 Hypoxemia: Secondary | ICD-10-CM | POA: Diagnosis present

## 2016-09-01 DIAGNOSIS — E782 Mixed hyperlipidemia: Secondary | ICD-10-CM | POA: Diagnosis not present

## 2016-09-01 DIAGNOSIS — Z7982 Long term (current) use of aspirin: Secondary | ICD-10-CM | POA: Diagnosis not present

## 2016-09-01 DIAGNOSIS — I255 Ischemic cardiomyopathy: Secondary | ICD-10-CM | POA: Diagnosis present

## 2016-09-01 DIAGNOSIS — R918 Other nonspecific abnormal finding of lung field: Secondary | ICD-10-CM | POA: Diagnosis not present

## 2016-09-01 DIAGNOSIS — J849 Interstitial pulmonary disease, unspecified: Secondary | ICD-10-CM

## 2016-09-01 DIAGNOSIS — R0602 Shortness of breath: Secondary | ICD-10-CM | POA: Diagnosis not present

## 2016-09-01 DIAGNOSIS — I5042 Chronic combined systolic (congestive) and diastolic (congestive) heart failure: Secondary | ICD-10-CM | POA: Diagnosis present

## 2016-09-01 DIAGNOSIS — I10 Essential (primary) hypertension: Secondary | ICD-10-CM | POA: Diagnosis not present

## 2016-09-01 DIAGNOSIS — I110 Hypertensive heart disease with heart failure: Secondary | ICD-10-CM | POA: Diagnosis present

## 2016-09-01 DIAGNOSIS — I712 Thoracic aortic aneurysm, without rupture: Secondary | ICD-10-CM | POA: Diagnosis not present

## 2016-09-01 DIAGNOSIS — Z79899 Other long term (current) drug therapy: Secondary | ICD-10-CM | POA: Diagnosis not present

## 2016-09-01 DIAGNOSIS — I2510 Atherosclerotic heart disease of native coronary artery without angina pectoris: Secondary | ICD-10-CM | POA: Diagnosis present

## 2016-09-01 DIAGNOSIS — Z87891 Personal history of nicotine dependence: Secondary | ICD-10-CM

## 2016-09-01 DIAGNOSIS — Z951 Presence of aortocoronary bypass graft: Secondary | ICD-10-CM | POA: Diagnosis not present

## 2016-09-01 DIAGNOSIS — J61 Pneumoconiosis due to asbestos and other mineral fibers: Secondary | ICD-10-CM

## 2016-09-01 DIAGNOSIS — I7121 Aneurysm of the ascending aorta, without rupture: Secondary | ICD-10-CM

## 2016-09-01 DIAGNOSIS — I739 Peripheral vascular disease, unspecified: Secondary | ICD-10-CM | POA: Diagnosis present

## 2016-09-01 LAB — URINALYSIS, ROUTINE W REFLEX MICROSCOPIC
BILIRUBIN URINE: NEGATIVE
Glucose, UA: NEGATIVE mg/dL
Hgb urine dipstick: NEGATIVE
Ketones, ur: NEGATIVE mg/dL
Nitrite: NEGATIVE
PH: 5 (ref 5.0–8.0)
Protein, ur: 30 mg/dL — AB
Squamous Epithelial / LPF: NONE SEEN

## 2016-09-01 LAB — CBC WITH DIFFERENTIAL/PLATELET
BASOS ABS: 0.1 10*3/uL (ref 0.0–0.1)
BASOS PCT: 1 %
Eosinophils Absolute: 0.1 10*3/uL (ref 0.0–0.7)
Eosinophils Relative: 1 %
HEMATOCRIT: 39.4 % (ref 39.0–52.0)
HEMOGLOBIN: 12.2 g/dL — AB (ref 13.0–17.0)
Lymphocytes Relative: 13 %
Lymphs Abs: 1.6 10*3/uL (ref 0.7–4.0)
MCH: 27.2 pg (ref 26.0–34.0)
MCHC: 31 g/dL (ref 30.0–36.0)
MCV: 87.9 fL (ref 78.0–100.0)
Monocytes Absolute: 1.5 10*3/uL — ABNORMAL HIGH (ref 0.1–1.0)
Monocytes Relative: 11 %
NEUTROS ABS: 9.6 10*3/uL — AB (ref 1.7–7.7)
NEUTROS PCT: 74 %
PLATELETS: 373 10*3/uL (ref 150–400)
RBC: 4.48 MIL/uL (ref 4.22–5.81)
RDW: 14.5 % (ref 11.5–15.5)
WBC: 12.8 10*3/uL — AB (ref 4.0–10.5)

## 2016-09-01 LAB — BASIC METABOLIC PANEL
Anion gap: 8 (ref 5–15)
BUN: 19 mg/dL (ref 6–20)
CHLORIDE: 104 mmol/L (ref 101–111)
CO2: 29 mmol/L (ref 22–32)
Calcium: 8.9 mg/dL (ref 8.9–10.3)
Creatinine, Ser: 1.04 mg/dL (ref 0.61–1.24)
GFR calc Af Amer: >60 mL/min (ref >60)
GFR calc non Af Amer: 58 mL/min — ABNORMAL LOW (ref >60)
Glucose, Bld: 146 mg/dL — ABNORMAL HIGH (ref 65–99)
POTASSIUM: 3.5 mmol/L (ref 3.5–5.1)
SODIUM: 141 mmol/L (ref 135–145)

## 2016-09-01 LAB — BRAIN NATRIURETIC PEPTIDE: B NATRIURETIC PEPTIDE 5: 588 pg/mL — AB (ref 0.0–100.0)

## 2016-09-01 LAB — TROPONIN I: Troponin I: 0.03 ng/mL (ref <0.03)

## 2016-09-01 MED ORDER — SIMVASTATIN 10 MG PO TABS
10.0000 mg | ORAL_TABLET | Freq: Every day | ORAL | Status: DC
  Administered 2016-09-01 – 2016-09-03 (×3): 10 mg via ORAL
  Filled 2016-09-01 (×3): qty 1

## 2016-09-01 MED ORDER — IPRATROPIUM-ALBUTEROL 0.5-2.5 (3) MG/3ML IN SOLN
3.0000 mL | Freq: Once | RESPIRATORY_TRACT | Status: AC
  Administered 2016-09-01: 3 mL via RESPIRATORY_TRACT
  Filled 2016-09-01: qty 3

## 2016-09-01 MED ORDER — DEXTROSE 5 % IV SOLN
500.0000 mg | INTRAVENOUS | Status: DC
  Administered 2016-09-02: 500 mg via INTRAVENOUS
  Filled 2016-09-01 (×3): qty 500

## 2016-09-01 MED ORDER — DUTASTERIDE 0.5 MG PO CAPS
0.5000 mg | ORAL_CAPSULE | Freq: Every day | ORAL | Status: DC
  Administered 2016-09-02 – 2016-09-03 (×2): 0.5 mg via ORAL
  Filled 2016-09-01 (×4): qty 1

## 2016-09-01 MED ORDER — IOPAMIDOL (ISOVUE-370) INJECTION 76%
60.0000 mL | Freq: Once | INTRAVENOUS | Status: AC | PRN
  Administered 2016-09-01: 60 mL via INTRAVENOUS

## 2016-09-01 MED ORDER — ALBUTEROL SULFATE (2.5 MG/3ML) 0.083% IN NEBU: 5.0000 mg | INHALATION_SOLUTION | Freq: Once | RESPIRATORY_TRACT | Status: DC

## 2016-09-01 MED ORDER — ASPIRIN 81 MG PO CHEW
162.0000 mg | CHEWABLE_TABLET | Freq: Every day | ORAL | Status: DC
  Administered 2016-09-02 – 2016-09-03 (×2): 162 mg via ORAL
  Filled 2016-09-01 (×2): qty 2

## 2016-09-01 MED ORDER — METHYLPREDNISOLONE SODIUM SUCC 40 MG IJ SOLR
40.0000 mg | Freq: Four times a day (QID) | INTRAMUSCULAR | Status: DC
  Administered 2016-09-02 – 2016-09-03 (×5): 40 mg via INTRAVENOUS
  Filled 2016-09-01 (×5): qty 1

## 2016-09-01 MED ORDER — DEXTROSE 5 % IV SOLN
1.0000 g | Freq: Once | INTRAVENOUS | Status: AC
  Administered 2016-09-01: 1 g via INTRAVENOUS
  Filled 2016-09-01: qty 10

## 2016-09-01 MED ORDER — HEPARIN SODIUM (PORCINE) 5000 UNIT/ML IJ SOLN
5000.0000 [IU] | Freq: Three times a day (TID) | INTRAMUSCULAR | Status: DC
  Administered 2016-09-01 – 2016-09-03 (×5): 5000 [IU] via SUBCUTANEOUS
  Filled 2016-09-01 (×5): qty 1

## 2016-09-01 MED ORDER — ENSURE ENLIVE PO LIQD
237.0000 mL | Freq: Two times a day (BID) | ORAL | Status: DC
  Administered 2016-09-02 – 2016-09-03 (×4): 237 mL via ORAL

## 2016-09-01 MED ORDER — CEFTRIAXONE SODIUM 1 G IJ SOLR
1.0000 g | INTRAMUSCULAR | Status: DC
Start: 2016-09-02 — End: 2016-09-03
  Administered 2016-09-02: 1 g via INTRAVENOUS
  Filled 2016-09-01 (×3): qty 10

## 2016-09-01 MED ORDER — IPRATROPIUM BROMIDE 0.02 % IN SOLN
1.0000 mg | Freq: Once | RESPIRATORY_TRACT | Status: AC
  Administered 2016-09-01: 1 mg via RESPIRATORY_TRACT
  Filled 2016-09-01: qty 5

## 2016-09-01 MED ORDER — ALBUTEROL SULFATE (2.5 MG/3ML) 0.083% IN NEBU
2.5000 mg | INHALATION_SOLUTION | Freq: Once | RESPIRATORY_TRACT | Status: AC
  Administered 2016-09-01: 2.5 mg via RESPIRATORY_TRACT
  Filled 2016-09-01: qty 3

## 2016-09-01 MED ORDER — DEXTROSE 5 % IV SOLN: 500.0000 mg | INTRAVENOUS | Status: DC

## 2016-09-01 MED ORDER — AMLODIPINE BESYLATE 5 MG PO TABS
5.0000 mg | ORAL_TABLET | Freq: Every day | ORAL | Status: DC
  Administered 2016-09-02 – 2016-09-03 (×2): 5 mg via ORAL
  Filled 2016-09-01 (×2): qty 1

## 2016-09-01 MED ORDER — TAMSULOSIN HCL 0.4 MG PO CAPS
0.4000 mg | ORAL_CAPSULE | Freq: Every day | ORAL | Status: DC
  Administered 2016-09-02 – 2016-09-03 (×2): 0.4 mg via ORAL
  Filled 2016-09-01 (×2): qty 1

## 2016-09-01 MED ORDER — CEFTRIAXONE SODIUM 1 G IJ SOLR: 1.0000 g | INTRAMUSCULAR | Status: DC

## 2016-09-01 MED ORDER — METOPROLOL SUCCINATE ER 25 MG PO TB24
25.0000 mg | ORAL_TABLET | Freq: Every day | ORAL | Status: DC
  Administered 2016-09-02 – 2016-09-03 (×2): 25 mg via ORAL
  Filled 2016-09-01 (×2): qty 1

## 2016-09-01 MED ORDER — ALBUTEROL SULFATE (2.5 MG/3ML) 0.083% IN NEBU: 2.5000 mg | INHALATION_SOLUTION | RESPIRATORY_TRACT | Status: DC | PRN

## 2016-09-01 MED ORDER — DUTASTERIDE-TAMSULOSIN HCL 0.5-0.4 MG PO CAPS: 1.0000 | ORAL_CAPSULE | Freq: Every day | ORAL | Status: DC

## 2016-09-01 MED ORDER — ALBUTEROL (5 MG/ML) CONTINUOUS INHALATION SOLN
10.0000 mg/h | INHALATION_SOLUTION | Freq: Once | RESPIRATORY_TRACT | Status: AC
  Administered 2016-09-01: 10 mg/h via RESPIRATORY_TRACT
  Filled 2016-09-01: qty 20

## 2016-09-01 MED ORDER — LISINOPRIL 10 MG PO TABS
20.0000 mg | ORAL_TABLET | Freq: Every day | ORAL | Status: DC
  Administered 2016-09-02 – 2016-09-03 (×2): 20 mg via ORAL
  Filled 2016-09-01 (×3): qty 2

## 2016-09-01 MED ORDER — ALBUTEROL SULFATE (2.5 MG/3ML) 0.083% IN NEBU
2.5000 mg | INHALATION_SOLUTION | Freq: Four times a day (QID) | RESPIRATORY_TRACT | Status: DC
  Administered 2016-09-02 (×3): 2.5 mg via RESPIRATORY_TRACT
  Filled 2016-09-01 (×3): qty 3

## 2016-09-01 MED ORDER — DEXTROSE 5 % IV SOLN
500.0000 mg | Freq: Once | INTRAVENOUS | Status: AC
  Administered 2016-09-01: 500 mg via INTRAVENOUS
  Filled 2016-09-01: qty 500

## 2016-09-01 MED ORDER — FUROSEMIDE 20 MG PO TABS
20.0000 mg | ORAL_TABLET | Freq: Every day | ORAL | Status: DC
  Administered 2016-09-02 – 2016-09-03 (×2): 20 mg via ORAL
  Filled 2016-09-01 (×2): qty 1

## 2016-09-01 MED ORDER — METHYLPREDNISOLONE SODIUM SUCC 125 MG IJ SOLR
125.0000 mg | Freq: Once | INTRAMUSCULAR | Status: AC
  Administered 2016-09-01: 125 mg via INTRAVENOUS
  Filled 2016-09-01: qty 2

## 2016-09-01 NOTE — H&P (Signed)
History and Physical    [name] QQP:[id number] DOB: 1917-10-07 DOA: 09/01/2016  PCP: Glenda Chroman, MD  Patient coming from:  Home.    Chief Complaint: SOB and wheezing.   HPI: [name] is an 83 y.o. male with CAD, PVD, s/p NSTEMI, mild carotid disease, known asbestos exposure and pipe smoker, presented to the ER with SOB, productive coughs, and wheezing.  CXR showed opacity, and CTA showed no PE, but revealed a 4cm anterior lingular mass.  He was given neb Tx, improved some, but still has wheezing and hypoxia.  He was given IV Steroids, and IV Rocephin and ZIthromax, and hospitalist was asked to admit him for further Tx.      ED Course:  See above.  Rewiew of Systems:  Constitutional: Negative for malaise, fever and chills. No significant weight loss or weight gain Eyes: Negative for eye pain, redness and discharge, diplopia, visual changes, or flashes of light. ENMT: Negative for ear pain, hoarseness, nasal congestion, sinus pressure and sore throat. No headaches; tinnitus, drooling, or problem swallowing. Cardiovascular: Negative for chest pain, palpitations, diaphoresis,  and peripheral edema. ; No orthopnea, PND Respiratory: Negative for cough, hemoptysis, wheezing and stridor. No pleuritic chestpain. Gastrointestinal: Negative for diarrhea, constipation,  melena, blood in stool, hematemesis, jaundice and rectal bleeding.    Genitourinary: Negative for frequency, dysuria, incontinence,flank pain and hematuria; Musculoskeletal: Negative for back pain and neck pain. Negative for swelling and trauma.;  Skin: . Negative for pruritus, rash, abrasions, bruising and skin lesion.; ulcerations Neuro: Negative for headache, lightheadedness and neck stiffness. Negative for weakness, altered level of consciousness , altered mental status, extremity weakness, burning feet, involuntary movement, seizure and syncope.  Psych: negative for anxiety, depression, insomnia, tearfulness, panic  attacks, hallucinations, paranoia, suicidal or homicidal ideation   Past Medical History:  Diagnosis Date  . Carotid artery disease (HCC)    Mild  . Coronary atherosclerosis of native coronary artery   . Essential hypertension, benign   . Ischemic cardiomyopathy    LVEF 40-45%  . Mixed hyperlipidemia   . Myocardial infarction    Remote NSTEMI  . PVD (peripheral vascular disease) ([place])     Past Surgical History:  Procedure Laterality Date  . CORONARY ARTERY BYPASS GRAFT  07/2003    Dr. Roxy Manns: LIMA to LAD, SVG to diagonal and OM, SVG to PDA  . FEMORAL HERNIA REPAIR       reports that he quit smoking about 38 years ago. His smoking use included Cigarettes and Pipe. He started smoking about 74 years ago. He has a 5.00 pack-year smoking history. He has never used smokeless tobacco. He reports that he does not drink alcohol or use drugs.  No Known Allergies  Family History  Problem Relation Age of Onset  . Coronary artery disease      Family h/o     Prior to Admission medications   Medication Sig Start Date End Date Taking? Authorizing Provider  amLODipine (NORVASC) 5 MG tablet TAKE 1 AND 1/2 TABLET BY MOUTH ONCE A DAY 05/12/16  Yes Satira Sark, MD  aspirin 325 MG tablet Take 162 mg by mouth daily.   Yes Historical Provider, MD  Dutasteride-Tamsulosin HCl (JALYN) 0.5-0.4 MG CAPS Take 1 tablet by mouth daily.   Yes Historical Provider, MD  furosemide (LASIX) 20 MG tablet Take 20 mg by mouth daily.   Yes Historical Provider, MD  lisinopril (PRINIVIL,ZESTRIL) 20 MG tablet Take 20 mg by mouth daily.   Yes  Historical Provider, MD  metoprolol succinate (TOPROL XL) 25 MG 24 hr tablet Take 1 tablet (25 mg total) by mouth daily. 11/06/15 11/05/16 Yes Satira Sark, MD  Multiple Vitamins-Minerals (CENTRUM SILVER PO) Take 1 tablet by mouth daily.   Yes Historical Provider, MD  nitroGLYCERIN (NITROSTAT) 0.4 MG SL tablet Place 0.4 mg under the tongue every 5 (five) minutes as needed for  chest pain.   Yes Satira Sark, MD  simvastatin (ZOCOR) 10 MG tablet TAKE 0.5 TABLETS (5 MG TOTAL) BY MOUTH EVERY EVENING. 06/13/16  Yes Satira Sark, MD    Physical Exam: Vitals:   09/01/16 1919 09/01/16 1930 09/01/16 2030 09/01/16 2039  BP:  (!) 163/67 125/70   Pulse:  (!) 48  60  Resp:  (!) 22 (!) 26 18  Temp:      TempSrc:      SpO2: 92% 100%  96%    Constitutional: NAD, calm, comfortable Vitals:   09/01/16 1919 09/01/16 1930 09/01/16 2030 09/01/16 2039  BP:  (!) 163/67 125/70   Pulse:  (!) 48  60  Resp:  (!) 22 (!) 26 18  Temp:      TempSrc:      SpO2: 92% 100%  96%   Eyes: PERRL, lids and conjunctivae normal ENMT: Mucous membranes are moist. Posterior pharynx clear of any exudate or lesions.Normal dentition.  Neck: normal, supple, no masses, no thyromegaly Respiratory: clear to auscultation bilaterally, significant wheezing.  no crackles. Normal respiratory effort. No accessory muscle use.  Cardiovascular: Regular rate and rhythm, no murmurs / rubs / gallops. No extremity edema. 2+ pedal pulses. No carotid bruits.  Abdomen: no tenderness, no masses palpated. No hepatosplenomegaly. Bowel sounds positive.  Musculoskeletal: no clubbing / cyanosis. No joint deformity upper and lower extremities. Good ROM, no contractures. Normal muscle tone.  Skin: no rashes, lesions, ulcers. No induration Neurologic: CN 2-12 grossly intact. Sensation intact, DTR normal. Strength 5/5 in all 4.  Psychiatric: Normal judgment and insight. Alert and oriented x 3. Normal mood.     Labs on Admission: I have personally reviewed following labs and imaging studies  CBC:  Recent Labs Lab 09/01/16 1605  WBC 12.8*  NEUTROABS 9.6*  HGB 12.2*  HCT 39.4  MCV 87.9  PLT 093   Basic Metabolic Panel:  Recent Labs Lab 09/01/16 1605  NA 141  K 3.5  CL 104  CO2 29  GLUCOSE 146*  BUN 19  CREATININE 1.04  CALCIUM 8.9   Cardiac Enzymes:  Recent Labs Lab 09/01/16 1605    TROPONINI 0.03*    Radiological Exams on Admission: Dg Chest 2 View  Result Date: 09/01/2016 CLINICAL DATA:  Shortness of breath with exertion. Productive cough for 2 days. History of CHF and hypertension. EXAM: CHEST  2 VIEW COMPARISON:  Chest radiograph February 19, 2014 FINDINGS: The cardiac silhouette is mildly enlarged unchanged. Tortuous calcified aorta. Chronic interstitial changes with scattered nodular densities, increased lung volumes dense RIGHT lung base calcified pleural plaque with pleural thickening. Enlarging RIGHT midlung zone airspace opacity. Increased lung volumes with flattened hemidiaphragms. Status post median sternotomy for CABG, chronically fractured caudal wire. No pneumothorax. Soft tissue planes included osseous structures are nonsuspicious. LEFT shoulder suspected loose body. IMPRESSION: LEFT midlung zone airspace opacity increased from prior imaging suspicious for mass. COPD with scattered nodular densities and RIGHT lung base pleural thickening and pleural plaque. Suspected chronic interstitial lung disease. For constellation of findings, recommend CT chest with contrast. Electronically Signed   By:  Elon Alas M.D.   On: 09/01/2016 17:23   Ct Angio Chest Pe W/cm &/or Wo Cm  Result Date: 09/01/2016 CLINICAL DATA:  Shortness of breath and productive cough for 2 days. Left lung opacity on chest radiograph. Ischemic cardiomyopathy. EXAM: CT ANGIOGRAPHY CHEST WITH CONTRAST TECHNIQUE: Multidetector CT imaging of the chest was performed using the standard protocol during bolus administration of intravenous contrast. Multiplanar CT image reconstructions and MIPs were obtained to evaluate the vascular anatomy. CONTRAST:  60 mL Isovue 370 COMPARISON:  Chest radiograph on 09/01/2016 and chest CT on 12/08/2003 FINDINGS: Cardiovascular: Satisfactory opacification of pulmonary arteries noted, and no pulmonary emboli identified. No evidence of thoracic aortic dissection. 4.5 cm  ascending thoracic aortic aneurysm shows no significant change compared to 2005 exam. Aortic atherosclerosis. Previous CABG. Mild cardiomegaly. No pericardial effusion. Mediastinum/Nodes: 1.8 cm right hilar lymph node. No other pathologically enlarged lymph nodes seen within the thorax. Lungs/Pleura: Diffuse bilateral calcified pleural plaque again seen, consistent with asbestos related pleural disease. There has been progression of chronic appearing bibasilar interstitial lung disease since previous study, suspicious for asbestosis. Mild progression of centrilobular emphysema is also noted. A new fluid opacity with several air bronchograms is seen in the anterior lingula which measures approximately 4.9 x 4.1 cm on image 51/4. This abuts the anterior chest wall, and is suspicious for bronchogenic carcinoma, although differential diagnosis also includes pneumonia and inflammatory etiologies. No evidence of pleural effusion. Upper abdomen: No acute findings.  Normal adrenal glands. Musculoskeletal: No suspicious bone lesions or other significant abnormality identified. Review of the MIP images confirms the above findings. IMPRESSION: No evidence of pulmonary embolism. New 4.9 cm confluent opacity in anterior lingula, which is suspicious for bronchogenic carcinoma, although differential diagnosis also includes pneumonia and inflammatory etiologies. Recommend correlation for clinical signs and symptoms of pneumonia, and consider short-term followup by chest CT in 1-2 months vs PET-CT for further evaluation. Mild right hilar lymphadenopathy. No other thoracic lymphadenopathy or pleural effusion. 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 Progressive centrilobular emphysema also noted. Stable 4.5 cm ascending thoracic aortic aneurysm. Recommend semi-annual imaging followup by CTA or MRA and referral to cardiothoracic surgery if not already obtained. This recommendation follows 2010  ACCF/AHA/AATS/ACR/ASA/SCA/SCAI/SIR/STS/SVM Guidelines for the Diagnosis and Management of Patients With Thoracic Aortic Disease. Circulation. 2010; 121: X540-G867 Electronically Signed   By: Earle Gell M.D.   On: 09/01/2016 18:29    EKG: Independently reviewed.   Assessment/Plan Principal Problem:   Mass of lingula of lung Active Problems:   Mass of lingula of lung   Carotid artery disease (HCC)   PVD (peripheral vascular disease) (HCC)  PLAN:   CAP: Will Tx with IV antibiotics.  Continue with oxygen, nebs, and IV steroids.  Lung mass:  This is very worrisome.  He was told that I am very concerned about this.  Given Asbestosis and prior tobacco use, it is probably bronchogenic carcinoma. Will consult pulmonary to see if tissue can be obtained.   IR may be an option as well.    Carotid disease:  No neurological symptoms.   DVT prophylaxis: SUBQ Heparin.  Code Status: FULL CODE.  Family Communication: step son.  Disposition Plan: to home./  Consults called: None.  Admission status: Inpatient.    Lajoy Vanamburg MD FACP. [facility]  If 7PM-7AM, please contact night-coverage [email or web address] Password [email or web address]  09/01/2016, 9:20 PM

## 2016-09-01 NOTE — ED Notes (Signed)
2 LPM of oxygen applied to patient via [place]. Sats increased to 96%.

## 2016-09-01 NOTE — Progress Notes (Signed)
Pharmacy Antibiotic Note  Ammon KEINAN BROUILLET is a 83 y.o. male admitted on 09/01/2016 with pneumonia.  Pharmacy has been consulted for Ceftriaxone and zithromax dosing.  Plan: Ceftriaxone 1gm IV q24h Zithromax '500mg'$  IV q24h f/u cxs and clinical progress [place] tx as indicated   Temp (24hrs), Avg:98 F (36.7 C), Min:98 F (36.7 C), Max:98 F (36.7 C)   Recent Labs Lab 09/01/16 1605  WBC 12.8*  CREATININE 1.04    CrCl cannot be calculated (Unknown ideal weight.).    No Known Allergies  Antimicrobials this admission: Ceftriaxone 4/5 >>  Zithromax 4/5 >>   Dose adjustments this admission: N/A  Microbiology results: N/A  Thank you for allowing pharmacy to be a part of this patient's care.  Isac Sarna, BS [place], [place] Clinical Pharmacist Pager [phone]  09/01/2016 9:37 PM

## 2016-09-01 NOTE — ED Notes (Signed)
CRITICAL VALUE ALERT  Critical value received:  Troponin 0.03  Date of notification:  09/01/2016  Time of notification:  6270  Critical value read back: YES  Nurse who received alert:  Lavenia Atlas, RN  MD notified:  Dr. Thurnell Garbe at [phone]

## 2016-09-01 NOTE — ED Triage Notes (Signed)
Sob with exertion.  Productive cough times 2 days.  Denies any pain.

## 2016-09-01 NOTE — ED Provider Notes (Signed)
[place] DEPT Provider Note   CSN: [id number] Arrival date & time: 09/01/16  1532     History   Chief Complaint Chief Complaint  Patient presents with  . Shortness of Breath    HPI [name] is a 83 y.o. male.  HPI  Pt was seen at 1545.  Per pt, c/o gradual onset and worsening of persistent SOB for the past 2 days. SOB worsens with exertion. Has been associated with cough.  Denies CP/palpitations, no back pain, no abd pain, no N/V/D, no fevers, no rash.    Past Medical History:  Diagnosis Date  . Carotid artery disease (HCC)    Mild  . Coronary atherosclerosis of native coronary artery   . Essential hypertension, benign   . Ischemic cardiomyopathy    LVEF 40-45%  . Mixed hyperlipidemia   . Myocardial infarction    Remote NSTEMI  . PVD (peripheral vascular disease) [facility])     Patient Active Problem List   Diagnosis Date Noted  . Chronic combined systolic and diastolic congestive heart failure ([place]) 04/22/2014  . Shortness of breath 03/11/2014  . Mixed hyperlipidemia 12/10/2009  . Essential hypertension, benign 12/10/2009  . CORONARY ATHEROSCLEROSIS NATIVE CORONARY ARTERY 12/10/2009    Past Surgical History:  Procedure Laterality Date  . CORONARY ARTERY BYPASS GRAFT  07/2003    Dr. Roxy Manns: LIMA to LAD, SVG to diagonal and OM, SVG to PDA  . FEMORAL HERNIA REPAIR         Home Medications    Prior to Admission medications   Medication Sig Start Date End Date Taking? Authorizing Provider  amLODipine (NORVASC) 5 MG tablet TAKE 1 AND 1/2 TABLET BY MOUTH ONCE A DAY 05/12/16   Satira Sark, MD  aspirin 325 MG tablet Take 162 mg by mouth daily.    Historical Provider, MD  Dutasteride-Tamsulosin HCl (JALYN) 0.5-0.4 MG CAPS Take 1 tablet by mouth daily.    Historical Provider, MD  furosemide (LASIX) 20 MG tablet Take 20 mg by mouth daily.    Historical Provider, MD  lisinopril (PRINIVIL,ZESTRIL) 20 MG tablet Take 20 mg by mouth daily.    Historical  Provider, MD  metoprolol succinate (TOPROL XL) 25 MG 24 hr tablet Take 1 tablet (25 mg total) by mouth daily. 11/06/15 11/05/16  Satira Sark, MD  Multiple Vitamins-Minerals (CENTRUM SILVER PO) Take 1 tablet by mouth daily.    Historical Provider, MD  nitroGLYCERIN (NITROSTAT) 0.4 MG SL tablet Place 0.4 mg under the tongue every 5 (five) minutes as needed for chest pain.    Satira Sark, MD  simvastatin (ZOCOR) 10 MG tablet TAKE 0.5 TABLETS (5 MG TOTAL) BY MOUTH EVERY EVENING. 06/13/16   Satira Sark, MD    Family History Family History  Problem Relation Age of Onset  . Coronary artery disease      Family h/o    Social History Social History  Substance Use Topics  . Smoking status: Former Smoker    Packs/day: 0.50    Years: 10.00    Types: Cigarettes, Pipe    Start date: 01/15/1942    Quit date: 05/30/1978  . Smokeless tobacco: Never Used     Comment: smoked a pipe the first 8 years then just cigarettes   . Alcohol use No     Allergies   Patient has no known allergies.   Review of Systems Review of Systems ROS: Statement: All systems negative except as marked or noted in the HPI; Constitutional: Negative  for fever and chills. ; ; Eyes: Negative for eye pain, redness and discharge. ; ; ENMT: Negative for ear pain, hoarseness, nasal congestion, sinus pressure and sore throat. ; ; Cardiovascular: Negative for chest pain, palpitations, diaphoresis, and peripheral edema. ; ; Respiratory: +cough, SOB. Negative for wheezing and stridor. ; ; Gastrointestinal: Negative for nausea, vomiting, diarrhea, abdominal pain, blood in stool, hematemesis, jaundice and rectal bleeding. . ; ; Genitourinary: Negative for dysuria, flank pain and hematuria. ; ; Musculoskeletal: Negative for back pain and neck pain. Negative for swelling and trauma.; ; Skin: Negative for pruritus, rash, abrasions, blisters, bruising and skin lesion.; ; Neuro: Negative for headache, lightheadedness and neck stiffness.  Negative for weakness, altered level of consciousness, altered mental status, extremity weakness, paresthesias, involuntary movement, seizure and syncope.       Physical Exam Updated Vital Signs BP (!) 165/56   Pulse 66   Temp 98 F (36.7 C) (Oral)   Resp (!) 24   SpO2 (!) 89%    Patient Vitals for the past 24 hrs:  BP Temp Temp src Pulse Resp SpO2  09/01/16 1700 (!) 160/77 - - (!) 56 (!) 21 100 %  09/01/16 1622 - - - - - 95 %  09/01/16 1616 - - - (!) 51 20 99 %  09/01/16 1607 - - - (!) 57 - (!) 83 %  09/01/16 1606 (!) 147/92 - - - - -  09/01/16 1536 (!) 165/56 98 F (36.7 C) Oral 66 (!) 24 (!) 89 %    Physical Exam 1550: Physical examination:  Nursing notes reviewed; Vital signs and O2 SAT reviewed;  Constitutional: Well developed, Well nourished, Uncomfortable appearing.; Head:  Normocephalic, atraumatic; Eyes: EOMI, PERRL, No scleral icterus; ENMT: Mouth and pharynx normal, Mucous membranes dry; Neck: Supple, Full range of motion, No lymphadenopathy; Cardiovascular: Regular rate and rhythm, No gallop; Respiratory: Breath sounds diminished & equal bilaterally, No wheezes.  Speaking short sentences, sitting upright, tachypneic.; Chest: Nontender, Movement normal; Abdomen: Soft, Nontender, Nondistended, Normal bowel sounds; Genitourinary: No CVA tenderness; Extremities: Pulses normal, No tenderness, +2 pedal edema bilat. No calf asymmetry.; Neuro: AA&Ox3, Major CN grossly intact.  Speech clear. No gross focal motor deficits in extremities.; Skin: Color normal, Warm, Dry.   ED Treatments / Results  Labs (all labs ordered are listed, but only abnormal results are displayed)   EKG  EKG Interpretation  Date/Time:  Thursday September 01 2016 15:38:20 EDT Ventricular Rate:  58 PR Interval:    QRS Duration: 90 QT Interval:  436 QTC Calculation: 428 R Axis:   -61 Text Interpretation:  Normal sinus rhythm Left anterior fascicular block Left axis deviation Artifact When compared with  ECG of 08/02/2016 No significant change was found Confirmed by [facility]  MD, Nunzio Cory [phone]) on 09/01/2016 3:57:00 PM       Radiology   Procedures Procedures (including critical care time)  Medications Ordered in ED Medications  albuterol (PROVENTIL) (2.5 MG/3ML) 0.083% nebulizer solution 2.5 mg (not administered)  ipratropium-albuterol (DUONEB) 0.5-2.5 (3) MG/3ML nebulizer solution 3 mL (not administered)     Initial Impression / Assessment and Plan / ED Course  I have reviewed the triage vital signs and the nursing notes.  Pertinent labs & imaging results that were available during my care of the patient were reviewed by me and considered in my medical decision making (see chart for details).  MDM Reviewed: previous chart, nursing note and vitals Reviewed previous: labs and ECG Interpretation: labs, ECG, x-ray and CT scan  Total time providing critical care: 30-74 minutes. This excludes time spent performing separately reportable procedures and services. Consults: admitting MD   CRITICAL CARE Performed by: Alfonzo Feller Total critical care time: 35 minutes Critical care time was exclusive of separately billable procedures and treating other patients. Critical care was necessary to treat or prevent imminent or life-threatening deterioration. Critical care was time spent personally by me on the following activities: development of treatment plan with patient and/or surrogate as well as nursing, discussions with consultants, evaluation of patient's response to treatment, examination of patient, obtaining history from patient or surrogate, ordering and performing treatments and interventions, ordering and review of laboratory studies, ordering and review of radiographic studies, pulse oximetry and re-evaluation of patient's condition.   Results for orders placed or performed during the hospital encounter of 82/95/62  Basic metabolic panel  Result Value Ref Range   Sodium 141  135 - 145 mmol/L   Potassium 3.5 3.5 - 5.1 mmol/L   Chloride 104 101 - 111 mmol/L   CO2 29 22 - 32 mmol/L   Glucose, Bld 146 (H) 65 - 99 mg/dL   BUN 19 6 - 20 mg/dL   Creatinine, Ser 1.04 0.61 - 1.24 mg/dL   Calcium 8.9 8.9 - 10.3 mg/dL   GFR calc non Af Amer 58 (L) >60 mL/min   GFR calc Af Amer >60 >60 mL/min   Anion gap 8 5 - 15  Brain natriuretic peptide  Result Value Ref Range   B Natriuretic Peptide 588.0 (H) 0.0 - 100.0 pg/mL  Troponin I  Result Value Ref Range   Troponin I 0.03 (HH) <0.03 ng/mL  CBC with Differential  Result Value Ref Range   WBC 12.8 (H) 4.0 - 10.5 K/uL   RBC 4.48 4.22 - 5.81 MIL/uL   Hemoglobin 12.2 (L) 13.0 - 17.0 g/dL   HCT 39.4 39.0 - 52.0 %   MCV 87.9 78.0 - 100.0 fL   MCH 27.2 26.0 - 34.0 pg   MCHC 31.0 30.0 - 36.0 g/dL   RDW 14.5 11.5 - 15.5 %   Platelets 373 150 - 400 K/uL   Neutrophils Relative % 74 %   Neutro Abs 9.6 (H) 1.7 - 7.7 K/uL   Lymphocytes Relative 13 %   Lymphs Abs 1.6 0.7 - 4.0 K/uL   Monocytes Relative 11 %   Monocytes Absolute 1.5 (H) 0.1 - 1.0 K/uL   Eosinophils Relative 1 %   Eosinophils Absolute 0.1 0.0 - 0.7 K/uL   Basophils Relative 1 %   Basophils Absolute 0.1 0.0 - 0.1 K/uL   Dg Chest 2 View Result Date: 09/01/2016 CLINICAL DATA:  Shortness of breath with exertion. Productive cough for 2 days. History of CHF and hypertension. EXAM: CHEST  2 VIEW COMPARISON:  Chest radiograph February 19, 2014 FINDINGS: The cardiac silhouette is mildly enlarged unchanged. Tortuous calcified aorta. Chronic interstitial changes with scattered nodular densities, increased lung volumes dense RIGHT lung base calcified pleural plaque with pleural thickening. Enlarging RIGHT midlung zone airspace opacity. Increased lung volumes with flattened hemidiaphragms. Status post median sternotomy for CABG, chronically fractured caudal wire. No pneumothorax. Soft tissue planes included osseous structures are nonsuspicious. LEFT shoulder suspected loose  body. IMPRESSION: LEFT midlung zone airspace opacity increased from prior imaging suspicious for mass. COPD with scattered nodular densities and RIGHT lung base pleural thickening and pleural plaque. Suspected chronic interstitial lung disease. For constellation of findings, recommend CT chest with contrast. Electronically Signed   By: Thana Farr.D.  On: 09/01/2016 17:23   Ct Angio Chest Pe W/cm &/or Wo Cm Result Date: 09/01/2016 CLINICAL DATA:  Shortness of breath and productive cough for 2 days. Left lung opacity on chest radiograph. Ischemic cardiomyopathy. EXAM: CT ANGIOGRAPHY CHEST WITH CONTRAST TECHNIQUE: Multidetector CT imaging of the chest was performed using the standard protocol during bolus administration of intravenous contrast. Multiplanar CT image reconstructions and MIPs were obtained to evaluate the vascular anatomy. CONTRAST:  60 mL Isovue 370 COMPARISON:  Chest radiograph on 09/01/2016 and chest CT on 12/08/2003 FINDINGS: Cardiovascular: Satisfactory opacification of pulmonary arteries noted, and no pulmonary emboli identified. No evidence of thoracic aortic dissection. 4.5 cm ascending thoracic aortic aneurysm shows no significant change compared to 2005 exam. Aortic atherosclerosis. Previous CABG. Mild cardiomegaly. No pericardial effusion. Mediastinum/Nodes: 1.8 cm right hilar lymph node. No other pathologically enlarged lymph nodes seen within the thorax. Lungs/Pleura: Diffuse bilateral calcified pleural plaque again seen, consistent with asbestos related pleural disease. There has been progression of chronic appearing bibasilar interstitial lung disease since previous study, suspicious for asbestosis. Mild progression of centrilobular emphysema is also noted. A new fluid opacity with several air bronchograms is seen in the anterior lingula which measures approximately 4.9 x 4.1 cm on image 51/4. This abuts the anterior chest wall, and is suspicious for bronchogenic carcinoma,  although differential diagnosis also includes pneumonia and inflammatory etiologies. No evidence of pleural effusion. Upper abdomen: No acute findings.  Normal adrenal glands. Musculoskeletal: No suspicious bone lesions or other significant abnormality identified. Review of the MIP images confirms the above findings. IMPRESSION: No evidence of pulmonary embolism. New 4.9 cm confluent opacity in anterior lingula, which is suspicious for bronchogenic carcinoma, although differential diagnosis also includes pneumonia and inflammatory etiologies. Recommend correlation for clinical signs and symptoms of pneumonia, and consider short-term followup by chest CT in 1-2 months vs PET-CT for further evaluation. Mild right hilar lymphadenopathy. No other thoracic lymphadenopathy or pleural effusion. 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 Progressive centrilobular emphysema also noted. Stable 4.5 cm ascending thoracic aortic aneurysm. Recommend semi-annual imaging followup by CTA or MRA and referral to cardiothoracic surgery if not already obtained. This recommendation follows 2010 ACCF/AHA/AATS/ACR/ASA/SCA/SCAI/SIR/STS/SVM Guidelines for the Diagnosis and Management of Patients With Thoracic Aortic Disease. Circulation. 2010; 121: E751-Z001 Electronically Signed   By: Earle Gell M.D.   On: 09/01/2016 18:29    1905:  Pt confirms he "used to be a smoker" and was definitely exposed to asbestos. Pt's O2 Sats 83% R/A on arrival to ED. Short neb given and O2 N/C applied; Sats 89-90% on N/C at rest. Lungs continue with bilat wheezing, no audible wheezing. Will dose IV solumedrol and start hour long neb.  Will tx for CAP and admit. Dx and testing d/w pt and family.  Questions answered.  Verb understanding, agreeable to admit. T/C to [facility]. [place] Comment, case discussed, including:  HPI, pertinent PM/SHx, VS/PE, dx testing, ED course and treatment:  Agreeable to admit.     Final Clinical  Impressions(s) / ED Diagnoses   Final diagnoses:  None    New Prescriptions New Prescriptions   No medications on file     Francine Graven, DO 09/04/16 0002

## 2016-09-02 DIAGNOSIS — J441 Chronic obstructive pulmonary disease with (acute) exacerbation: Secondary | ICD-10-CM

## 2016-09-02 DIAGNOSIS — J189 Pneumonia, unspecified organism: Principal | ICD-10-CM

## 2016-09-02 DIAGNOSIS — I10 Essential (primary) hypertension: Secondary | ICD-10-CM

## 2016-09-02 DIAGNOSIS — E782 Mixed hyperlipidemia: Secondary | ICD-10-CM

## 2016-09-02 MED ORDER — IPRATROPIUM-ALBUTEROL 0.5-2.5 (3) MG/3ML IN SOLN
3.0000 mL | Freq: Four times a day (QID) | RESPIRATORY_TRACT | Status: DC
  Administered 2016-09-02 – 2016-09-03 (×4): 3 mL via RESPIRATORY_TRACT
  Filled 2016-09-02 (×4): qty 3

## 2016-09-02 MED ORDER — GUAIFENESIN ER 600 MG PO TB12
1200.0000 mg | ORAL_TABLET | Freq: Two times a day (BID) | ORAL | Status: DC
  Administered 2016-09-02 – 2016-09-03 (×2): 1200 mg via ORAL
  Filled 2016-09-02 (×2): qty 2

## 2016-09-02 NOTE — Care Management Note (Addendum)
Case Management Note  Patient Details  Name: [name] MRN: [id number] Date of Birth: 31-Mar-1918  Subjective/Objective: Adm with PNA/?lung mass. Lives alone, but has good family support. Family at bedside. DIL is a nurse and checks on patient daily as well as other family members. He has a RW and cane PTA. He has PCP, Dr. Woody Seller, still drives to appointments. He has Medicare and reports no issues affording medications. Currently on 2 Liters of oxygen, no home oxygen. No HH PTA.  Family at bedside.             Action/Plan: Anticipate DC home with self care. CM will follow for needs. ? Oxygen needs or if patient would have qualifying diagnosis for oxygen.    Expected Discharge Date:      09/04/2016           Expected Discharge Plan:     In-House Referral:     Discharge planning Services  CM Consult  Post Acute Care Choice:    Choice offered to:     DME Arranged:    DME Agency:     HH Arranged:    HH Agency:     Status of Service:  In process, will continue to follow  If discussed at Long Length of Stay Meetings, dates discussed:    Additional Comments:  Keshanna Riso, Chauncey Reading, RN 09/02/2016, 12:31 PM

## 2016-09-02 NOTE — Progress Notes (Signed)
PROGRESS NOTE    [name]  TMH:[id number] DOB: 12/04/1917 DOA: 09/01/2016 PCP: Glenda Chroman, MD    Brief Narrative:  83 year old male with a history of COPD, presented to the hospital with cough and shortness of breath. CT of the chest indicated mass in the lingula of lung. He was admitted for possible pneumonia and COPD exacerbation. Started on IV antibiotics and steroids. Pulmonology following. Recommendations are for treatment with antibiotics after which chest imaging should be repeated. Patient is clinically improving. Anticipate discharge in the next 24 hours.   Assessment & Plan:   Principal Problem:   Mass of lingula of lung Active Problems:   Mixed hyperlipidemia   Essential hypertension, benign   Mass of lingula of lung   Carotid artery disease (HCC)   PVD (peripheral vascular disease) (HCC)   CAP (community acquired pneumonia)   COPD with acute exacerbation ([place])   1. Community-acquired pneumonia. Started on IV antibiotics. Continue pulmonary hygiene  2. Mass of lingula of lung. Possibly related to pneumonia. Pulmonology following. Patient will need to complete a course of antibiotics and then repeat imaging. Further workup. Consider at that time if abnormal findings persist  3. COPD exacerbation. Wheezing appears to be improving. Continue on steroids and antibiotics. Continue bronchodilators.  4. Hypertension. Continue amlodipine, metoprolol, lisinopril  5. Hyperlipidemia. Continue statin.   DVT prophylaxis: heparin Code Status: full code Family Communication: no family present Disposition Plan: possible discharge hom in AM if improved   Consultants:   pulmonology  Procedures:     Antimicrobials:   Rocephin 4/5>>  Azithromycin 4/5>>   Subjective: Feeling better. Has productive cough  Objective: Vitals:   09/02/16 0058 09/02/16 0540 09/02/16 0718 09/02/16 1321  BP:  (!) 157/77  (!) 141/74  Pulse:  68  65  Resp:  18  18  Temp:  97.8  F (36.6 C)  98.5 F (36.9 C)  TempSrc:  Oral  Oral  SpO2: 97% 94% 94% 93%  Weight:        Intake/Output Summary (Last 24 hours) at 09/02/16 1824 Last data filed at 09/02/16 1300  Gross per 24 hour  Intake              530 ml  Output              800 ml  Net             -270 ml   Filed Weights   09/01/16 2217  Weight: 70.1 kg (154 lb 8.7 oz)    Examination:  General exam: Appears calm and comfortable  Respiratory system: Clear to auscultation. Respiratory effort normal. Cardiovascular system: S1 & S2 heard, RRR. No JVD, murmurs, rubs, gallops or clicks. No pedal edema. Gastrointestinal system: Abdomen is nondistended, soft and nontender. No organomegaly or masses felt. Normal bowel sounds heard. Central nervous system: Alert and oriented. No focal neurological deficits. Extremities: Symmetric 5 x 5 power. Skin: No rashes, lesions or ulcers Psychiatry: Judgement and insight appear normal. Mood & affect appropriate.     Data Reviewed: I have personally reviewed following labs and imaging studies  CBC:  Recent Labs Lab 09/01/16 1605  WBC 12.8*  NEUTROABS 9.6*  HGB 12.2*  HCT 39.4  MCV 87.9  PLT 921   Basic Metabolic Panel:  Recent Labs Lab 09/01/16 1605  NA 141  K 3.5  CL 104  CO2 29  GLUCOSE 146*  BUN 19  CREATININE 1.04  CALCIUM 8.9   GFR: Estimated Creatinine  Clearance: 41.2 mL/min (by C-G formula based on SCr of 1.04 mg/dL). Liver Function Tests: No results for input(s): AST, ALT, ALKPHOS, BILITOT, PROT, ALBUMIN in the last 168 hours. No results for input(s): LIPASE, AMYLASE in the last 168 hours. No results for input(s): AMMONIA in the last 168 hours. Coagulation Profile: No results for input(s): INR, PROTIME in the last 168 hours. Cardiac Enzymes:  Recent Labs Lab 09/01/16 1605  TROPONINI 0.03*   BNP (last 3 results) No results for input(s): PROBNP in the last 8760 hours. HbA1C: No results for input(s): HGBA1C in the last 72  hours. CBG: No results for input(s): GLUCAP in the last 168 hours. Lipid Profile: No results for input(s): CHOL, HDL, LDLCALC, TRIG, CHOLHDL, LDLDIRECT in the last 72 hours. Thyroid Function Tests: No results for input(s): TSH, T4TOTAL, FREET4, T3FREE, THYROIDAB in the last 72 hours. Anemia Panel: No results for input(s): VITAMINB12, FOLATE, FERRITIN, TIBC, IRON, RETICCTPCT in the last 72 hours. Sepsis Labs: No results for input(s): PROCALCITON, LATICACIDVEN in the last 168 hours.  No results found for this or any previous visit (from the past 240 hour(s)).       Radiology Studies: Dg Chest 2 View  Result Date: 09/01/2016 CLINICAL DATA:  Shortness of breath with exertion. Productive cough for 2 days. History of CHF and hypertension. EXAM: CHEST  2 VIEW COMPARISON:  Chest radiograph February 19, 2014 FINDINGS: The cardiac silhouette is mildly enlarged unchanged. Tortuous calcified aorta. Chronic interstitial changes with scattered nodular densities, increased lung volumes dense RIGHT lung base calcified pleural plaque with pleural thickening. Enlarging RIGHT midlung zone airspace opacity. Increased lung volumes with flattened hemidiaphragms. Status post median sternotomy for CABG, chronically fractured caudal wire. No pneumothorax. Soft tissue planes included osseous structures are nonsuspicious. LEFT shoulder suspected loose body. IMPRESSION: LEFT midlung zone airspace opacity increased from prior imaging suspicious for mass. COPD with scattered nodular densities and RIGHT lung base pleural thickening and pleural plaque. Suspected chronic interstitial lung disease. For constellation of findings, recommend CT chest with contrast. Electronically Signed   By: Elon Alas M.D.   On: 09/01/2016 17:23   Ct Angio Chest Pe W/cm &/or Wo Cm  Result Date: 09/01/2016 CLINICAL DATA:  Shortness of breath and productive cough for 2 days. Left lung opacity on chest radiograph. Ischemic cardiomyopathy.  EXAM: CT ANGIOGRAPHY CHEST WITH CONTRAST TECHNIQUE: Multidetector CT imaging of the chest was performed using the standard protocol during bolus administration of intravenous contrast. Multiplanar CT image reconstructions and MIPs were obtained to evaluate the vascular anatomy. CONTRAST:  60 mL Isovue 370 COMPARISON:  Chest radiograph on 09/01/2016 and chest CT on 12/08/2003 FINDINGS: Cardiovascular: Satisfactory opacification of pulmonary arteries noted, and no pulmonary emboli identified. No evidence of thoracic aortic dissection. 4.5 cm ascending thoracic aortic aneurysm shows no significant change compared to 2005 exam. Aortic atherosclerosis. Previous CABG. Mild cardiomegaly. No pericardial effusion. Mediastinum/Nodes: 1.8 cm right hilar lymph node. No other pathologically enlarged lymph nodes seen within the thorax. Lungs/Pleura: Diffuse bilateral calcified pleural plaque again seen, consistent with asbestos related pleural disease. There has been progression of chronic appearing bibasilar interstitial lung disease since previous study, suspicious for asbestosis. Mild progression of centrilobular emphysema is also noted. A new fluid opacity with several air bronchograms is seen in the anterior lingula which measures approximately 4.9 x 4.1 cm on image 51/4. This abuts the anterior chest wall, and is suspicious for bronchogenic carcinoma, although differential diagnosis also includes pneumonia and inflammatory etiologies. No evidence of pleural effusion.  Upper abdomen: No acute findings.  Normal adrenal glands. Musculoskeletal: No suspicious bone lesions or other significant abnormality identified. Review of the MIP images confirms the above findings. IMPRESSION: No evidence of pulmonary embolism. New 4.9 cm confluent opacity in anterior lingula, which is suspicious for bronchogenic carcinoma, although differential diagnosis also includes pneumonia and inflammatory etiologies. Recommend correlation for clinical  signs and symptoms of pneumonia, and consider short-term followup by chest CT in 1-2 months vs PET-CT for further evaluation. Mild right hilar lymphadenopathy. No other thoracic lymphadenopathy or pleural effusion. 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 Progressive centrilobular emphysema also noted. Stable 4.5 cm ascending thoracic aortic aneurysm. Recommend semi-annual imaging followup by CTA or MRA and referral to cardiothoracic surgery if not already obtained. This recommendation follows 2010 ACCF/AHA/AATS/ACR/ASA/SCA/SCAI/SIR/STS/SVM Guidelines for the Diagnosis and Management of Patients With Thoracic Aortic Disease. Circulation. 2010; 121: X450-T888 Electronically Signed   By: Earle Gell M.D.   On: 09/01/2016 18:29        Scheduled Meds: . amLODipine  5 mg Oral Daily  . aspirin  162 mg Oral Daily  . azithromycin  500 mg Intravenous Q24H  . cefTRIAXone (ROCEPHIN)  IV  1 g Intravenous Q24H  . dutasteride  0.5 mg Oral Daily   And  . tamsulosin  0.4 mg Oral Daily  . feeding supplement (ENSURE ENLIVE)  237 mL Oral BID BM  . furosemide  20 mg Oral Daily  . guaiFENesin  1,200 mg Oral BID  . heparin  5,000 Units Subcutaneous Q8H  . ipratropium-albuterol  3 mL Nebulization Q6H  . lisinopril  20 mg Oral Daily  . methylPREDNISolone (SOLU-MEDROL) injection  40 mg Intravenous Q6H  . metoprolol succinate  25 mg Oral Daily  . simvastatin  10 mg Oral q1800   Continuous Infusions:   LOS: 1 day    Time spent: 69mns    Astin Rape, MD [facility] Pager [phone] If 7PM-7AM, please contact night-coverage [email or web address] Password [email or web address]4/10/2016, 6:24 PM

## 2016-09-02 NOTE — Progress Notes (Signed)
Initial Nutrition Assessment  DOCUMENTATION CODES:   Not applicable  INTERVENTION:  Ensure Enlive po BID, each supplement provides 350 kcal and 20 grams of protein    NUTRITION DIAGNOSIS:   Inadequate oral intake related to acute illness as evidenced by percent weight loss.   GOAL:   Patient will meet greater than or equal to 90% of their needs   MONITOR:   PO intake, Supplement acceptance, Labs, Weight trends  REASON FOR ASSESSMENT:   Malnutrition Screening Tool    ASSESSMENT: 83 yo with pneumonia. He is independent with driving, cooks for himself and shops for food. He reports good appetite today and says he ate most (90%) of breakfast. The patients home diet is regular. He consistently eats at [employer] where he meets friends for breakfast to have a biscuit and coffee together. At lunch he likes [employer] in [place]. On Sundays he attends to different church services. [name] says his weight is down 4-5# 4% in the past month which coincides with hospital records. Nutrition-Focused physical exam completed. Findings are mild fat depletion, mild temporal muscle depletion, and mild BLE edema. Not expected to be acute given his age and level of function. He doesn't meet criteria for malnutrition at this time.    Recent Labs Lab 09/01/16 1605  NA 141  K 3.5  CL 104  CO2 29  BUN 19  CREATININE 1.04  CALCIUM 8.9  GLUCOSE 146*   Labs and meds reviewed.   Diet Order:  Diet regular Room service appropriate? Yes; Fluid consistency: Thin  Skin:   dry scaly- around his ears  Last BM:  08/31/16   Height:   Ht Readings from Last 1 Encounters:  08/02/16 '5\' 11"'$  (1.803 m)    Weight:   Wt Readings from Last 1 Encounters:  09/01/16 154 lb 8.7 oz (70.1 kg)    Ideal Body Weight:   78 kg  BMI:  Body mass index is 21.55 kg/m.  Estimated Nutritional Needs:   Kcal:  1750-1960 (25-28 kcal/kg)  Protein:  84-90 gr (1.2-1.3 gr /kg)   Fluid:  1.8-1.9 liters  daily  EDUCATION NEEDS:   No education needs identified at this time  Colman Cater MS,RD,CSG,LDN Office: #[phone] Pager: [phone]

## 2016-09-02 NOTE — Consult Note (Signed)
Consult requested by: [facility] Consult requested for: Abnormal chest CT  HPI: This is a 83 year old who has multiple medical problems including COPD, peripheral arterial disease, coronary artery occlusive disease, ischemic cardiomyopathy and to came to the emergency department with shortness of breath cough wheezing [place] feeling. He was given treatments in the emergency department but still had wheezing and hypoxia and was started on IV antibiotics and IV steroids. He stopped smoking several years ago but has a 50-pack-year smoking history. He also smoked a pipe. He had asbestos exposure and he does have areas of look like pleural plaques on his CT. He says however he is normally able to do all of his work at home he lives alone he does not have shortness of breath he doesn't use any inhalers and he doesn't use oxygen. He has been coughing productively. He's not having any chest pain. He is unaware of any family history of pulmonary disease.  Past Medical History:  Diagnosis Date  . Carotid artery disease (HCC)    Mild  . Coronary atherosclerosis of native coronary artery   . Essential hypertension, benign   . Ischemic cardiomyopathy    LVEF 40-45%  . Mixed hyperlipidemia   . Myocardial infarction    Remote NSTEMI  . PVD (peripheral vascular disease) (HCC)      Family History  Problem Relation Age of Onset  . Coronary artery disease      Family h/o     Social History   Social History  . Marital status: Married    Spouse name: N/A  . Number of children: N/A  . Years of education: N/A   Social History Main Topics  . Smoking status: Former Smoker    Packs/day: 0.50    Years: 10.00    Types: Cigarettes, Pipe    Start date: 01/15/1942    Quit date: 05/30/1978  . Smokeless tobacco: Never Used     Comment: smoked a pipe the first 8 years then just cigarettes   . Alcohol use No  . Drug use: No  . Sexual activity: Not Asked   Other Topics Concern  . None   Social  History Narrative  . None     ROS: Except as mentioned 10 point review of systems is negative    Objective: Vital signs in last 24 hours: Temp:  [97.8 F (36.6 C)-98.1 F (36.7 C)] 97.8 F (36.6 C) (04/06 0540) Pulse Rate:  [48-68] 68 (04/06 0540) Resp:  [18-27] 18 (04/06 0540) BP: (125-165)/(56-92) 157/77 (04/06 0540) SpO2:  [83 %-100 %] 94 % (04/06 0718) Weight:  [70.1 kg (154 lb 8.7 oz)] 70.1 kg (154 lb 8.7 oz) (04/05 2217) Weight change:  Last BM Date: 08/31/16  Intake/Output from previous day: 04/05 0701 - 04/06 0700 In: 290 [P.O.:240; IV Piggyback:50] Out: 400 [Urine:400]  PHYSICAL EXAM Constitutional: He is awake and alert and in no acute distress. Eyes: Pupils react. EOMI. Ears nose mouth and throat: He is somewhat hard of hearing. He appears to have had surgical removal of part of his left pinna. Throat is clear. Cardiovascular: His heart is regular with normal heart sounds. No edema. Respiratory: His respiratory effort is normal and his lungs show some rhonchi bilaterally but no wheezing now. Skin: Warm and dry. Gastrointestinal: His abdomen is soft with no masses. Musculoskeletal: Strength is normal. Neurological: No focal abnormalities. Psychiatric: Normal mood and affect  Lab Results: Basic Metabolic Panel:  Recent Labs  09/01/16 1605  NA 141  K 3.5  CL 104  CO2 29  GLUCOSE 146*  BUN 19  CREATININE 1.04  CALCIUM 8.9   Liver Function Tests: No results for input(s): AST, ALT, ALKPHOS, BILITOT, PROT, ALBUMIN in the last 72 hours. No results for input(s): LIPASE, AMYLASE in the last 72 hours. No results for input(s): AMMONIA in the last 72 hours. CBC:  Recent Labs  09/01/16 1605  WBC 12.8*  NEUTROABS 9.6*  HGB 12.2*  HCT 39.4  MCV 87.9  PLT 373   Cardiac Enzymes:  Recent Labs  09/01/16 1605  TROPONINI 0.03*   BNP: No results for input(s): PROBNP in the last 72 hours. D-Dimer: No results for input(s): DDIMER in the last 72  hours. CBG: No results for input(s): GLUCAP in the last 72 hours. Hemoglobin A1C: No results for input(s): HGBA1C in the last 72 hours. Fasting Lipid Panel: No results for input(s): CHOL, HDL, LDLCALC, TRIG, CHOLHDL, LDLDIRECT in the last 72 hours. Thyroid Function Tests: No results for input(s): TSH, T4TOTAL, FREET4, T3FREE, THYROIDAB in the last 72 hours. Anemia Panel: No results for input(s): VITAMINB12, FOLATE, FERRITIN, TIBC, IRON, RETICCTPCT in the last 72 hours. Coagulation: No results for input(s): LABPROT, INR in the last 72 hours. Urine Drug Screen: Drugs of Abuse  No results found for: LABOPIA, COCAINSCRNUR, LABBENZ, AMPHETMU, THCU, LABBARB  Alcohol Level: No results for input(s): ETH in the last 72 hours. Urinalysis:  Recent Labs  09/01/16 1544  COLORURINE YELLOW  LABSPEC >1.046*  PHURINE 5.0  GLUCOSEU NEGATIVE  HGBUR NEGATIVE  BILIRUBINUR NEGATIVE  KETONESUR NEGATIVE  PROTEINUR 30*  NITRITE NEGATIVE  LEUKOCYTESUR LARGE*   Misc. Labs:   ABGS: No results for input(s): PHART, PO2ART, TCO2, HCO3 in the last 72 hours.  Invalid input(s): PCO2   MICROBIOLOGY: No results found for this or any previous visit (from the past 240 hour(s)).  Studies/Results: Dg Chest 2 View  Result Date: 09/01/2016 CLINICAL DATA:  Shortness of breath with exertion. Productive cough for 2 days. History of CHF and hypertension. EXAM: CHEST  2 VIEW COMPARISON:  Chest radiograph February 19, 2014 FINDINGS: The cardiac silhouette is mildly enlarged unchanged. Tortuous calcified aorta. Chronic interstitial changes with scattered nodular densities, increased lung volumes dense RIGHT lung base calcified pleural plaque with pleural thickening. Enlarging RIGHT midlung zone airspace opacity. Increased lung volumes with flattened hemidiaphragms. Status post median sternotomy for CABG, chronically fractured caudal wire. No pneumothorax. Soft tissue planes included osseous structures are  nonsuspicious. LEFT shoulder suspected loose body. IMPRESSION: LEFT midlung zone airspace opacity increased from prior imaging suspicious for mass. COPD with scattered nodular densities and RIGHT lung base pleural thickening and pleural plaque. Suspected chronic interstitial lung disease. For constellation of findings, recommend CT chest with contrast. Electronically Signed   By: Elon Alas M.D.   On: 09/01/2016 17:23   Ct Angio Chest Pe W/cm &/or Wo Cm  Result Date: 09/01/2016 CLINICAL DATA:  Shortness of breath and productive cough for 2 days. Left lung opacity on chest radiograph. Ischemic cardiomyopathy. EXAM: CT ANGIOGRAPHY CHEST WITH CONTRAST TECHNIQUE: Multidetector CT imaging of the chest was performed using the standard protocol during bolus administration of intravenous contrast. Multiplanar CT image reconstructions and MIPs were obtained to evaluate the vascular anatomy. CONTRAST:  60 mL Isovue 370 COMPARISON:  Chest radiograph on 09/01/2016 and chest CT on 12/08/2003 FINDINGS: Cardiovascular: Satisfactory opacification of pulmonary arteries noted, and no pulmonary emboli identified. No evidence of thoracic aortic dissection. 4.5 cm ascending thoracic aortic aneurysm shows no significant change compared to  2005 exam. Aortic atherosclerosis. Previous CABG. Mild cardiomegaly. No pericardial effusion. Mediastinum/Nodes: 1.8 cm right hilar lymph node. No other pathologically enlarged lymph nodes seen within the thorax. Lungs/Pleura: Diffuse bilateral calcified pleural plaque again seen, consistent with asbestos related pleural disease. There has been progression of chronic appearing bibasilar interstitial lung disease since previous study, suspicious for asbestosis. Mild progression of centrilobular emphysema is also noted. A new fluid opacity with several air bronchograms is seen in the anterior lingula which measures approximately 4.9 x 4.1 cm on image 51/4. This abuts the anterior chest wall, and  is suspicious for bronchogenic carcinoma, although differential diagnosis also includes pneumonia and inflammatory etiologies. No evidence of pleural effusion. Upper abdomen: No acute findings.  Normal adrenal glands. Musculoskeletal: No suspicious bone lesions or other significant abnormality identified. Review of the MIP images confirms the above findings. IMPRESSION: No evidence of pulmonary embolism. New 4.9 cm confluent opacity in anterior lingula, which is suspicious for bronchogenic carcinoma, although differential diagnosis also includes pneumonia and inflammatory etiologies. Recommend correlation for clinical signs and symptoms of pneumonia, and consider short-term followup by chest CT in 1-2 months vs PET-CT for further evaluation. Mild right hilar lymphadenopathy. No other thoracic lymphadenopathy or pleural effusion. 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 Progressive centrilobular emphysema also noted. Stable 4.5 cm ascending thoracic aortic aneurysm. Recommend semi-annual imaging followup by CTA or MRA and referral to cardiothoracic surgery if not already obtained. This recommendation follows 2010 ACCF/AHA/AATS/ACR/ASA/SCA/SCAI/SIR/STS/SVM Guidelines for the Diagnosis and Management of Patients With Thoracic Aortic Disease. Circulation. 2010; 121: W263-Z858 Electronically Signed   By: Earle Gell M.D.   On: 09/01/2016 18:29    Medications:  Prior to Admission:  Prescriptions Prior to Admission  Medication Sig Dispense Refill Last Dose  . amLODipine (NORVASC) 5 MG tablet TAKE 1 AND 1/2 TABLET BY MOUTH ONCE A DAY 45 tablet 6 09/01/2016 at Unknown time  . aspirin 325 MG tablet Take 162 mg by mouth daily.   09/01/2016 at Unknown time  . Dutasteride-Tamsulosin HCl (JALYN) 0.5-0.4 MG CAPS Take 1 tablet by mouth daily.   09/01/2016 at Unknown time  . furosemide (LASIX) 20 MG tablet Take 20 mg by mouth daily.   09/01/2016 at Unknown time  . lisinopril  (PRINIVIL,ZESTRIL) 20 MG tablet Take 20 mg by mouth daily.   09/01/2016 at Unknown time  . metoprolol succinate (TOPROL XL) 25 MG 24 hr tablet Take 1 tablet (25 mg total) by mouth daily. 90 tablet 3 09/01/2016 at [place]  . Multiple Vitamins-Minerals (CENTRUM SILVER PO) Take 1 tablet by mouth daily.   09/01/2016 at Unknown time  . nitroGLYCERIN (NITROSTAT) 0.4 MG SL tablet Place 0.4 mg under the tongue every 5 (five) minutes as needed for chest pain.   unknown  . simvastatin (ZOCOR) 10 MG tablet TAKE 0.5 TABLETS (5 MG TOTAL) BY MOUTH EVERY EVENING. 15 tablet 6 08/31/2016 at Unknown time   Scheduled: . albuterol  2.5 mg Nebulization Q6H  . amLODipine  5 mg Oral Daily  . aspirin  162 mg Oral Daily  . azithromycin  500 mg Intravenous Q24H  . cefTRIAXone (ROCEPHIN)  IV  1 g Intravenous Q24H  . dutasteride  0.5 mg Oral Daily   And  . tamsulosin  0.4 mg Oral Daily  . feeding supplement (ENSURE ENLIVE)  237 mL Oral BID BM  . furosemide  20 mg Oral Daily  . heparin  5,000 Units Subcutaneous Q8H  . lisinopril  20 mg Oral Daily  .  methylPREDNISolone (SOLU-MEDROL) injection  40 mg Intravenous Q6H  . metoprolol succinate  25 mg Oral Daily  . simvastatin  10 mg Oral q1800   Continuous:  YWV:[id number]  Assesment: He has abnormal chest CT. Given the clinical situation this certainly could be pneumonia as opposed to a bronchogenic carcinoma. I have personally reviewed the CT. I discussed the situation with Mr. Zurn. He is in favor of treating pneumonia and repeat CT once he has improved from that. We discussed other possibilities such as proceeding to biopsy but he really does not want to do that. I think the appropriate type of biopsy for this lesion would be needle biopsy if needed because it is up against the chest wall and I don't think I can reach it with the bronchoscope. I would continue treatment for COPD exacerbation and community-acquired pneumonia. Principal Problem:   Mass of lingula of lung Active  Problems:   Mass of lingula of lung   Carotid artery disease (HCC)   PVD (peripheral vascular disease) ([place])    Plan: As above.  Thanks for allowing me to see him with you    LOS: 1 day   Uchechi Denison L 09/02/2016, 8:37 AM

## 2016-09-02 NOTE — Care Management Important Message (Signed)
Important Message  Patient Details  Name: [name] MRN: [id number] Date of Birth: 1917/09/07   Medicare Important Message Given:  Yes    Joshawa Dubin, Chauncey Reading, RN 09/02/2016, 12:35 PM

## 2016-09-03 LAB — CBC
HEMATOCRIT: 39 % (ref 39.0–52.0)
Hemoglobin: 12.1 g/dL — ABNORMAL LOW (ref 13.0–17.0)
MCH: 27.3 pg (ref 26.0–34.0)
MCHC: 31 g/dL (ref 30.0–36.0)
MCV: 88 fL (ref 78.0–100.0)
Platelets: 401 10*3/uL — ABNORMAL HIGH (ref 150–400)
RBC: 4.43 MIL/uL (ref 4.22–5.81)
RDW: 14.6 % (ref 11.5–15.5)
WBC: 23.1 10*3/uL — AB (ref 4.0–10.5)

## 2016-09-03 LAB — BASIC METABOLIC PANEL
ANION GAP: 8 (ref 5–15)
BUN: 31 mg/dL — ABNORMAL HIGH (ref 6–20)
CO2: 30 mmol/L (ref 22–32)
Calcium: 8.9 mg/dL (ref 8.9–10.3)
Chloride: 103 mmol/L (ref 101–111)
Creatinine, Ser: 1.28 mg/dL — ABNORMAL HIGH (ref 0.61–1.24)
GFR calc Af Amer: 53 mL/min — ABNORMAL LOW (ref >60)
GFR calc non Af Amer: 45 mL/min — ABNORMAL LOW (ref >60)
GLUCOSE: 152 mg/dL — AB (ref 65–99)
Potassium: 3.9 mmol/L (ref 3.5–5.1)
Sodium: 141 mmol/L (ref 135–145)

## 2016-09-03 MED ORDER — PREDNISONE 10 MG PO TABS: ORAL_TABLET | ORAL | 0 refills | Status: DC

## 2016-09-03 MED ORDER — CEFUROXIME AXETIL 500 MG PO TABS: 500.0000 mg | ORAL_TABLET | Freq: Two times a day (BID) | ORAL | 0 refills | Status: DC

## 2016-09-03 MED ORDER — GUAIFENESIN ER 600 MG PO TB12: 600.0000 mg | ORAL_TABLET | Freq: Two times a day (BID) | ORAL | 0 refills | Status: AC

## 2016-09-03 MED ORDER — AZITHROMYCIN 500 MG PO TABS: 500.0000 mg | ORAL_TABLET | Freq: Every day | ORAL | 0 refills | Status: DC

## 2016-09-03 MED ORDER — ALBUTEROL SULFATE HFA 108 (90 BASE) MCG/ACT IN AERS: 2.0000 | INHALATION_SPRAY | Freq: Four times a day (QID) | RESPIRATORY_TRACT | 2 refills | Status: DC | PRN

## 2016-09-03 MED ORDER — AMLODIPINE BESYLATE 5 MG PO TABS: 5.0000 mg | ORAL_TABLET | Freq: Every day | ORAL | 6 refills | Status: AC

## 2016-09-03 NOTE — Progress Notes (Addendum)
Notified Chasity, [place] rep of orders for home O2 and need for oxygen to be delivered to home and hospital room for discharge home today. States they will notify referral team. Home O2 order, facesheet faxed as requested. Donavan Foil, RN

## 2016-09-03 NOTE — Progress Notes (Signed)
Called to patient's room by family at 89. Stated "we're taking him home". Stated she spoke with [facility] rep and was told home O2 for discharge would be delivered between [place]. Nursing staff was told home O2 tank for discharge home would arrive within the hour at 1647. Discussed safety concerns of leaving without O2 and still awaiting home O2 delivery to house. Notified Sherron Flemings, RN Nursing Supervisor of issue. Nursing spoke with [facility] delivery rep once again, stated delivery to hospital had been cancelled per family request to take him home and they were on the way to deliver O2 to the home. Nursing supervisor contacted RT to discuss possibility of loaning full hospital O2 tank for ride home with family understanding to return tomorrow. RT to assess and determine if okay. RT spoke with RT supervisor, stated ok to send O2 tank with patient. Dr. Roderic Palau stated okay to discharge patient with loaned O2 tank if [facility] could guarantee they were on the way to delivery home O2 soon. MD aware patient will get prescriptions in the am since pharmacy already closed, stated okay. Nursing supervisor spoke with [facility] delivery personnel, stated they would arrive at patient's home within 30 mintues. RT reviewed operation of portable O2 tank and safety precautions with patient and family member. Pt left floor in stable condition via w/c, O2 in place, accompanied by Sherron Flemings, RN and RT. Donavan Foil, RN

## 2016-09-03 NOTE — Progress Notes (Signed)
Called [facility] to follow-up on when home O2 would be delivered for discharge at family request. Stated they did not receive faxed info. Information re-faxed as requested. Donavan Foil, RN

## 2016-09-03 NOTE — Progress Notes (Signed)
Subjective: He says he feels much better. His IV came out last night. He is still coughing up some sputum. He has no other new complaints. No chest pain hemoptysis nausea or vomiting  Objective: Vital signs in last 24 hours: Temp:  [98.3 F (36.8 C)-98.5 F (36.9 C)] 98.3 F (36.8 C) (04/07 0600) Pulse Rate:  [52-65] 60 (04/07 0600) Resp:  [16-18] 16 (04/07 0600) BP: (141-160)/(60-74) 148/62 (04/07 0600) SpO2:  [90 %-98 %] 98 % (04/07 0730) Weight change:  Last BM Date: 09/03/16  Intake/Output from previous day: 04/06 0701 - 04/07 0700 In: 480 [P.O.:480] Out: 700 [Urine:700]  PHYSICAL EXAM General appearance: alert, cooperative and no distress Resp: rhonchi bilaterally Cardio: regular rate and rhythm, S1, S2 normal, no murmur, click, rub or gallop GI: soft, non-tender; bowel sounds normal; no masses,  no organomegaly Extremities: extremities normal, atraumatic, no cyanosis or edema Skin warm and dry. Mucous membranes are moist  Lab Results:  Results for orders placed or performed during the hospital encounter of 09/01/16 (from the past 48 hour(s))  Urinalysis, Routine w reflex microscopic     Status: Abnormal   Collection Time: 09/01/16  3:44 PM  Result Value Ref Range   Color, Urine YELLOW YELLOW   APPearance CLOUDY (A) CLEAR   Specific Gravity, Urine >1.046 (H) 1.005 - 1.030   pH 5.0 5.0 - 8.0   Glucose, UA NEGATIVE NEGATIVE mg/dL   Hgb urine dipstick NEGATIVE NEGATIVE   Bilirubin Urine NEGATIVE NEGATIVE   Ketones, ur NEGATIVE NEGATIVE mg/dL   Protein, ur 30 (A) NEGATIVE mg/dL   Nitrite NEGATIVE NEGATIVE   Leukocytes, UA LARGE (A) NEGATIVE   RBC / HPF 0-5 0 - 5 RBC/hpf   WBC, UA TOO NUMEROUS TO COUNT 0 - 5 WBC/hpf   Bacteria, UA RARE (A) NONE SEEN   Squamous Epithelial / LPF NONE SEEN NONE SEEN   WBC Clumps PRESENT    Budding Yeast PRESENT   Basic metabolic panel     Status: Abnormal   Collection Time: 09/01/16  4:05 PM  Result Value Ref Range   Sodium 141 135  - 145 mmol/L   Potassium 3.5 3.5 - 5.1 mmol/L   Chloride 104 101 - 111 mmol/L   CO2 29 22 - 32 mmol/L   Glucose, Bld 146 (H) 65 - 99 mg/dL   BUN 19 6 - 20 mg/dL   Creatinine, Ser 1.04 0.61 - 1.24 mg/dL   Calcium 8.9 8.9 - 10.3 mg/dL   GFR calc non Af Amer 58 (L) >60 mL/min   GFR calc Af Amer >60 >60 mL/min    Comment: (NOTE) The eGFR has been calculated using the CKD EPI equation. This calculation has not been validated in all clinical situations. eGFR's persistently <60 mL/min signify possible Chronic Kidney Disease.    Anion gap 8 5 - 15  Brain natriuretic peptide     Status: Abnormal   Collection Time: 09/01/16  4:05 PM  Result Value Ref Range   B Natriuretic Peptide 588.0 (H) 0.0 - 100.0 pg/mL  Troponin I     Status: Abnormal   Collection Time: 09/01/16  4:05 PM  Result Value Ref Range   Troponin I 0.03 (HH) <0.03 ng/mL    Comment: CRITICAL RESULT CALLED TO, READ BACK BY AND VERIFIED WITH: WALLACE,L ON 09/01/16 AT 1710 BY LOY,C   CBC with Differential     Status: Abnormal   Collection Time: 09/01/16  4:05 PM  Result Value Ref Range   WBC  12.8 (H) 4.0 - 10.5 K/uL   RBC 4.48 4.22 - 5.81 MIL/uL   Hemoglobin 12.2 (L) 13.0 - 17.0 g/dL   HCT 39.4 39.0 - 52.0 %   MCV 87.9 78.0 - 100.0 fL   MCH 27.2 26.0 - 34.0 pg   MCHC 31.0 30.0 - 36.0 g/dL   RDW 14.5 11.5 - 15.5 %   Platelets 373 150 - 400 K/uL   Neutrophils Relative % 74 %   Neutro Abs 9.6 (H) 1.7 - 7.7 K/uL   Lymphocytes Relative 13 %   Lymphs Abs 1.6 0.7 - 4.0 K/uL   Monocytes Relative 11 %   Monocytes Absolute 1.5 (H) 0.1 - 1.0 K/uL   Eosinophils Relative 1 %   Eosinophils Absolute 0.1 0.0 - 0.7 K/uL   Basophils Relative 1 %   Basophils Absolute 0.1 0.0 - 0.1 K/uL  CBC     Status: Abnormal   Collection Time: 09/03/16  6:02 AM  Result Value Ref Range   WBC 23.1 (H) 4.0 - 10.5 K/uL   RBC 4.43 4.22 - 5.81 MIL/uL   Hemoglobin 12.1 (L) 13.0 - 17.0 g/dL   HCT 39.0 39.0 - 52.0 %   MCV 88.0 78.0 - 100.0 fL   MCH  27.3 26.0 - 34.0 pg   MCHC 31.0 30.0 - 36.0 g/dL   RDW 14.6 11.5 - 15.5 %   Platelets 401 (H) 150 - 400 K/uL  Basic metabolic panel     Status: Abnormal   Collection Time: 09/03/16  6:02 AM  Result Value Ref Range   Sodium 141 135 - 145 mmol/L   Potassium 3.9 3.5 - 5.1 mmol/L   Chloride 103 101 - 111 mmol/L   CO2 30 22 - 32 mmol/L   Glucose, Bld 152 (H) 65 - 99 mg/dL   BUN 31 (H) 6 - 20 mg/dL   Creatinine, Ser 1.28 (H) 0.61 - 1.24 mg/dL   Calcium 8.9 8.9 - 10.3 mg/dL   GFR calc non Af Amer 45 (L) >60 mL/min   GFR calc Af Amer 53 (L) >60 mL/min    Comment: (NOTE) The eGFR has been calculated using the CKD EPI equation. This calculation has not been validated in all clinical situations. eGFR's persistently <60 mL/min signify possible Chronic Kidney Disease.    Anion gap 8 5 - 15    ABGS No results for input(s): PHART, PO2ART, TCO2, HCO3 in the last 72 hours.  Invalid input(s): PCO2 CULTURES No results found for this or any previous visit (from the past 240 hour(s)). Studies/Results: Dg Chest 2 View  Result Date: 09/01/2016 CLINICAL DATA:  Shortness of breath with exertion. Productive cough for 2 days. History of CHF and hypertension. EXAM: CHEST  2 VIEW COMPARISON:  Chest radiograph February 19, 2014 FINDINGS: The cardiac silhouette is mildly enlarged unchanged. Tortuous calcified aorta. Chronic interstitial changes with scattered nodular densities, increased lung volumes dense RIGHT lung base calcified pleural plaque with pleural thickening. Enlarging RIGHT midlung zone airspace opacity. Increased lung volumes with flattened hemidiaphragms. Status post median sternotomy for CABG, chronically fractured caudal wire. No pneumothorax. Soft tissue planes included osseous structures are nonsuspicious. LEFT shoulder suspected loose body. IMPRESSION: LEFT midlung zone airspace opacity increased from prior imaging suspicious for mass. COPD with scattered nodular densities and RIGHT lung base  pleural thickening and pleural plaque. Suspected chronic interstitial lung disease. For constellation of findings, recommend CT chest with contrast. Electronically Signed   By: Elon Alas M.D.   On: 09/01/2016  17:23   Ct Angio Chest Pe W/cm &/or Wo Cm  Result Date: 09/01/2016 CLINICAL DATA:  Shortness of breath and productive cough for 2 days. Left lung opacity on chest radiograph. Ischemic cardiomyopathy. EXAM: CT ANGIOGRAPHY CHEST WITH CONTRAST TECHNIQUE: Multidetector CT imaging of the chest was performed using the standard protocol during bolus administration of intravenous contrast. Multiplanar CT image reconstructions and MIPs were obtained to evaluate the vascular anatomy. CONTRAST:  60 mL Isovue 370 COMPARISON:  Chest radiograph on 09/01/2016 and chest CT on 12/08/2003 FINDINGS: Cardiovascular: Satisfactory opacification of pulmonary arteries noted, and no pulmonary emboli identified. No evidence of thoracic aortic dissection. 4.5 cm ascending thoracic aortic aneurysm shows no significant change compared to 2005 exam. Aortic atherosclerosis. Previous CABG. Mild cardiomegaly. No pericardial effusion. Mediastinum/Nodes: 1.8 cm right hilar lymph node. No other pathologically enlarged lymph nodes seen within the thorax. Lungs/Pleura: Diffuse bilateral calcified pleural plaque again seen, consistent with asbestos related pleural disease. There has been progression of chronic appearing bibasilar interstitial lung disease since previous study, suspicious for asbestosis. Mild progression of centrilobular emphysema is also noted. A new fluid opacity with several air bronchograms is seen in the anterior lingula which measures approximately 4.9 x 4.1 cm on image 51/4. This abuts the anterior chest wall, and is suspicious for bronchogenic carcinoma, although differential diagnosis also includes pneumonia and inflammatory etiologies. No evidence of pleural effusion. Upper abdomen: No acute findings.  Normal  adrenal glands. Musculoskeletal: No suspicious bone lesions or other significant abnormality identified. Review of the MIP images confirms the above findings. IMPRESSION: No evidence of pulmonary embolism. New 4.9 cm confluent opacity in anterior lingula, which is suspicious for bronchogenic carcinoma, although differential diagnosis also includes pneumonia and inflammatory etiologies. Recommend correlation for clinical signs and symptoms of pneumonia, and consider short-term followup by chest CT in 1-2 months vs PET-CT for further evaluation. Mild right hilar lymphadenopathy. No other thoracic lymphadenopathy or pleural effusion. 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 Progressive centrilobular emphysema also noted. Stable 4.5 cm ascending thoracic aortic aneurysm. Recommend semi-annual imaging followup by CTA or MRA and referral to cardiothoracic surgery if not already obtained. This recommendation follows 2010 ACCF/AHA/AATS/ACR/ASA/SCA/SCAI/SIR/STS/SVM Guidelines for the Diagnosis and Management of Patients With Thoracic Aortic Disease. Circulation. 2010; 121: Q300-P233 Electronically Signed   By: Earle Gell M.D.   On: 09/01/2016 18:29    Medications:  Prior to Admission:  Prescriptions Prior to Admission  Medication Sig Dispense Refill Last Dose  . amLODipine (NORVASC) 5 MG tablet TAKE 1 AND 1/2 TABLET BY MOUTH ONCE A DAY 45 tablet 6 09/01/2016 at Unknown time  . aspirin 325 MG tablet Take 162 mg by mouth daily.   09/01/2016 at Unknown time  . Dutasteride-Tamsulosin HCl (JALYN) 0.5-0.4 MG CAPS Take 1 tablet by mouth daily.   09/01/2016 at Unknown time  . furosemide (LASIX) 20 MG tablet Take 20 mg by mouth daily.   09/01/2016 at Unknown time  . lisinopril (PRINIVIL,ZESTRIL) 20 MG tablet Take 20 mg by mouth daily.   09/01/2016 at Unknown time  . metoprolol succinate (TOPROL XL) 25 MG 24 hr tablet Take 1 tablet (25 mg total) by mouth daily. 90 tablet 3 09/01/2016 at [place]   . Multiple Vitamins-Minerals (CENTRUM SILVER PO) Take 1 tablet by mouth daily.   09/01/2016 at Unknown time  . nitroGLYCERIN (NITROSTAT) 0.4 MG SL tablet Place 0.4 mg under the tongue every 5 (five) minutes as needed for chest pain.   unknown  . simvastatin (  ZOCOR) 10 MG tablet TAKE 0.5 TABLETS (5 MG TOTAL) BY MOUTH EVERY EVENING. 15 tablet 6 08/31/2016 at Unknown time   Scheduled: . amLODipine  5 mg Oral Daily  . aspirin  162 mg Oral Daily  . azithromycin  500 mg Intravenous Q24H  . cefTRIAXone (ROCEPHIN)  IV  1 g Intravenous Q24H  . dutasteride  0.5 mg Oral Daily   And  . tamsulosin  0.4 mg Oral Daily  . feeding supplement (ENSURE ENLIVE)  237 mL Oral BID BM  . furosemide  20 mg Oral Daily  . guaiFENesin  1,200 mg Oral BID  . heparin  5,000 Units Subcutaneous Q8H  . ipratropium-albuterol  3 mL Nebulization Q6H  . lisinopril  20 mg Oral Daily  . methylPREDNISolone (SOLU-MEDROL) injection  40 mg Intravenous Q6H  . metoprolol succinate  25 mg Oral Daily  . simvastatin  10 mg Oral q1800   Continuous:  XFF:[id number]  Assesment: He was admitted with community-acquired pneumonia and there is a question as to whether the area on his CT is pneumonia or a mass. He does have significant smoking history in the past so he is at risk of lung cancer. He is not interested in any current biopsy and wants to proceed with the recommendation to have his antibiotics finished and then wait 4-6 weeks and repeat CT versus PET CT he says he feels better. He is still coughing. Principal Problem:   Mass of lingula of lung Active Problems:   Mixed hyperlipidemia   Essential hypertension, benign   Mass of lingula of lung   Carotid artery disease (HCC)   PVD (peripheral vascular disease) (HCC)   CAP (community acquired pneumonia)   COPD with acute exacerbation ([place])    Plan: Discussed with hospitalist attending. Okay for discharge from pulmonary point of view. He may require home oxygen    LOS: 2 days    Tyrease Vandeberg L 09/03/2016, 10:12 AM

## 2016-09-03 NOTE — Progress Notes (Signed)
SATURATION QUALIFICATIONS: (This note is used to comply with regulatory documentation for home oxygen)  Patient Saturations on Room Air at Rest = 90%  Patient Saturations on Room Air while Ambulating = 87%  Patient Saturations on 2 Liters of oxygen while Ambulating = 97%  Please briefly explain why patient needs home oxygen: Text-paged Dr. Roderic Palau to notify of desaturation on r/a while ambulating.

## 2016-09-03 NOTE — Progress Notes (Signed)
Called [facility] to follow-up again regarding home O2 delivery for discharge. [facility] rep states delivery personnel had just received order and should deliver to hospital room within the hour. Notified patient/family. Donavan Foil, RN

## 2016-09-03 NOTE — Discharge Summary (Signed)
Physician Discharge Summary  [name] IOE:[id number] DOB: [birth year] DOA: 09/01/2016  PCP: Glenda Chroman, MD  Admit date: 09/01/2016 Discharge date: 09/03/2016  Admitted From: Home Disposition:  Home  Recommendations for Outpatient Follow-up:  1. Follow up with PCP in 1-2 weeks 2. Please obtain BMP/CBC in one week 3. Repeat CT chest with IV contrast in 4-6 weeks  Home Health:No Equipment/Devices:Oxygen at 2 L  Discharge Condition:Stable CODE STATUS:Full code Diet recommendation: Heart Healthy   Brief/Interim Summary: 83 year old male with a history of COPD, presented to the hospital with cough and shortness of breath. CT of the chest indicated mass in the lingula of lung. He was admitted for possible pneumonia and COPD exacerbation. Started on IV antibiotics and steroids. Pulmonology following. Recommendations are for treatment with antibiotics after which chest imaging should be repeated.   Discharge Diagnoses:  Principal Problem:   Mass of lingula of lung Active Problems:   Mixed hyperlipidemia   Essential hypertension, benign   Mass of lingula of lung   Carotid artery disease (HCC)   PVD (peripheral vascular disease) (HCC)   CAP (community acquired pneumonia)   COPD with acute exacerbation ([place])  1. Community-acquired pneumonia. Patient was treated with intravenous Rocephin and azithromycin. He's been transitioned to oral antibiotics. He is currently afebrile.  2. Mass of lingula of lung. Patient was noted to have a possible mass on CT imaging. He was unclear if this was a true mass versus underlying pneumonia. Pulmonology following. It was decided that patient would need to complete his course of antibiotics and after 4-6 weeks, could undergo repeat imaging to see if mass/pneumonia had improved. If findings persist on repeat CT, can consider further workup at that time.  3. COPD exacerbation. Patient was treated with intravenous steroids, bronchodilators and  antibiotics. He's been transitioned to prednisone taper and was discharged with an albuterol inhaler. He became hypoxic on ambulation and qualify for supplemental oxygen. He was discharged on 2 L of oxygen.  4. Hypertension. Continue amlodipine, metoprolol, lisinopril  5. Hyperlipidemia. Continue statin.  Discharge Instructions  Discharge Instructions    Diet - low sodium heart healthy    Complete by:  As directed    Increase activity slowly    Complete by:  As directed      Allergies as of 09/03/2016   No Known Allergies     Medication List    TAKE these medications   albuterol 108 (90 Base) MCG/ACT inhaler Commonly known as:  PROVENTIL HFA;VENTOLIN HFA Inhale 2 puffs into the lungs every 6 (six) hours as needed for wheezing or shortness of breath.   amLODipine 5 MG tablet Commonly known as:  NORVASC Take 1 tablet (5 mg total) by mouth daily. What changed:  See the new instructions.   aspirin 325 MG tablet Take 162 mg by mouth daily.   azithromycin 500 MG tablet Commonly known as:  ZITHROMAX Take 1 tablet (500 mg total) by mouth daily.   cefUROXime 500 MG tablet Commonly known as:  CEFTIN Take 1 tablet (500 mg total) by mouth 2 (two) times daily with a meal.   CENTRUM SILVER PO Take 1 tablet by mouth daily.   furosemide 20 MG tablet Commonly known as:  LASIX Take 20 mg by mouth daily.   guaiFENesin 600 MG 12 hr tablet Commonly known as:  MUCINEX Take 1 tablet (600 mg total) by mouth 2 (two) times daily.   JALYN 0.5-0.4 MG Caps Generic drug:  Dutasteride-Tamsulosin HCl Take 1 tablet by  mouth daily.   lisinopril 20 MG tablet Commonly known as:  PRINIVIL,ZESTRIL Take 20 mg by mouth daily.   metoprolol succinate 25 MG 24 hr tablet Commonly known as:  TOPROL XL Take 1 tablet (25 mg total) by mouth daily.   nitroGLYCERIN 0.4 MG SL tablet Commonly known as:  NITROSTAT Place 0.4 mg under the tongue every 5 (five) minutes as needed for chest pain.    predniSONE 10 MG tablet Commonly known as:  DELTASONE Take '40mg'$  po daily for 2 days then '30mg'$  daily for 2 days then '20mg'$  daily for 2 days then '10mg'$  daily for 2 days then stop   simvastatin 10 MG tablet Commonly known as:  ZOCOR TAKE 0.5 TABLETS (5 MG TOTAL) BY MOUTH EVERY EVENING.            Durable Medical Equipment        Start     Ordered   09/03/16 1242  For home use only DME oxygen  Once    Question Answer Comment  Mode or (Route) Nasal cannula   Liters per Minute 2   Frequency Continuous (stationary and portable oxygen unit needed)   Oxygen conserving device Yes   Oxygen delivery system Gas      09/03/16 1241     Follow-up Information    Glenda Chroman, MD. Schedule an appointment as soon as possible for a visit in 1 week(s).   Specialty:  Internal Medicine Contact information: [place] [id number] [phone]        HAWKINS,EDWARD L, MD. Call.   Specialty:  Pulmonary Disease Why:  as instructed by physician for follow-up Contact information: [place] [address] [phone]          No Known Allergies  Consultations:  Pulmonology   Procedures/Studies: Dg Chest 2 View  Result Date: 09/01/2016 CLINICAL DATA:  Shortness of breath with exertion. Productive cough for 2 days. History of CHF and hypertension. EXAM: CHEST  2 VIEW COMPARISON:  Chest radiograph February 19, 2014 FINDINGS: The cardiac silhouette is mildly enlarged unchanged. Tortuous calcified aorta. Chronic interstitial changes with scattered nodular densities, increased lung volumes dense RIGHT lung base calcified pleural plaque with pleural thickening. Enlarging RIGHT midlung zone airspace opacity. Increased lung volumes with flattened hemidiaphragms. Status post median sternotomy for CABG, chronically fractured caudal wire. No pneumothorax. Soft tissue planes included osseous structures are nonsuspicious. LEFT shoulder suspected loose body.  IMPRESSION: LEFT midlung zone airspace opacity increased from prior imaging suspicious for mass. COPD with scattered nodular densities and RIGHT lung base pleural thickening and pleural plaque. Suspected chronic interstitial lung disease. For constellation of findings, recommend CT chest with contrast. Electronically Signed   By: Elon Alas M.D.   On: 09/01/2016 17:23   Ct Angio Chest Pe W/cm &/or Wo Cm  Result Date: 09/01/2016 CLINICAL DATA:  Shortness of breath and productive cough for 2 days. Left lung opacity on chest radiograph. Ischemic cardiomyopathy. EXAM: CT ANGIOGRAPHY CHEST WITH CONTRAST TECHNIQUE: Multidetector CT imaging of the chest was performed using the standard protocol during bolus administration of intravenous contrast. Multiplanar CT image reconstructions and MIPs were obtained to evaluate the vascular anatomy. CONTRAST:  60 mL Isovue 370 COMPARISON:  Chest radiograph on 09/01/2016 and chest CT on 12/08/2003 FINDINGS: Cardiovascular: Satisfactory opacification of pulmonary arteries noted, and no pulmonary emboli identified. No evidence of thoracic aortic dissection. 4.5 cm ascending thoracic aortic aneurysm shows no significant change compared to 2005 exam. Aortic  atherosclerosis. Previous CABG. Mild cardiomegaly. No pericardial effusion. Mediastinum/Nodes: 1.8 cm right hilar lymph node. No other pathologically enlarged lymph nodes seen within the thorax. Lungs/Pleura: Diffuse bilateral calcified pleural plaque again seen, consistent with asbestos related pleural disease. There has been progression of chronic appearing bibasilar interstitial lung disease since previous study, suspicious for asbestosis. Mild progression of centrilobular emphysema is also noted. A new fluid opacity with several air bronchograms is seen in the anterior lingula which measures approximately 4.9 x 4.1 cm on image 51/4. This abuts the anterior chest wall, and is suspicious for bronchogenic carcinoma, although  differential diagnosis also includes pneumonia and inflammatory etiologies. No evidence of pleural effusion. Upper abdomen: No acute findings.  Normal adrenal glands. Musculoskeletal: No suspicious bone lesions or other significant abnormality identified. Review of the MIP images confirms the above findings. IMPRESSION: No evidence of pulmonary embolism. New 4.9 cm confluent opacity in anterior lingula, which is suspicious for bronchogenic carcinoma, although differential diagnosis also includes pneumonia and inflammatory etiologies. Recommend correlation for clinical signs and symptoms of pneumonia, and consider short-term followup by chest CT in 1-2 months vs PET-CT for further evaluation. Mild right hilar lymphadenopathy. No other thoracic lymphadenopathy or pleural effusion. 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 Progressive centrilobular emphysema also noted. Stable 4.5 cm ascending thoracic aortic aneurysm. Recommend semi-annual imaging followup by CTA or MRA and referral to cardiothoracic surgery if not already obtained. This recommendation follows 2010 ACCF/AHA/AATS/ACR/ASA/SCA/SCAI/SIR/STS/SVM Guidelines for the Diagnosis and Management of Patients With Thoracic Aortic Disease. Circulation. 2010; 121: Q469-G295 Electronically Signed   By: Earle Gell M.D.   On: 09/01/2016 18:29       Subjective: Feels better. Still has productive cough. Overall shortness of breath has improved.  Discharge Exam: Vitals:   09/03/16 0600 09/03/16 1311  BP: (!) 148/62 (!) 151/67  Pulse: 60 62  Resp: 16 18  Temp: 98.3 F (36.8 C) 98.6 F (37 C)   Vitals:   09/03/16 0138 09/03/16 0600 09/03/16 0730 09/03/16 1311  BP:  (!) 148/62  (!) 151/67  Pulse:  60  62  Resp:  16  18  Temp:  98.3 F (36.8 C)  98.6 F (37 C)  TempSrc:  Oral  Oral  SpO2: 90% 98% 98% 94%  Weight:        General: Pt is alert, awake, not in acute distress Cardiovascular: RRR, S1/S2 +, no  rubs, no gallops Respiratory: Scattered rhonchi, no wheezing Abdominal: Soft, NT, ND, bowel sounds + Extremities: no edema, no cyanosis    The results of significant diagnostics from this hospitalization (including imaging, microbiology, ancillary and laboratory) are listed below for reference.     Microbiology: No results found for this or any previous visit (from the past 240 hour(s)).   Labs: BNP (last 3 results)  Recent Labs  09/01/16 1605  BNP 284.1*   Basic Metabolic Panel:  Recent Labs Lab 09/01/16 1605 09/03/16 0602  NA 141 141  K 3.5 3.9  CL 104 103  CO2 29 30  GLUCOSE 146* 152*  BUN 19 31*  CREATININE 1.04 1.28*  CALCIUM 8.9 8.9   Liver Function Tests: No results for input(s): AST, ALT, ALKPHOS, BILITOT, PROT, ALBUMIN in the last 168 hours. No results for input(s): LIPASE, AMYLASE in the last 168 hours. No results for input(s): AMMONIA in the last 168 hours. CBC:  Recent Labs Lab 09/01/16 1605 09/03/16 0602  WBC 12.8* 23.1*  NEUTROABS 9.6*  --   HGB 12.2*  12.1*  HCT 39.4 39.0  MCV 87.9 88.0  PLT 373 401*   Cardiac Enzymes:  Recent Labs Lab 09/01/16 1605  TROPONINI 0.03*   BNP: Invalid input(s): POCBNP CBG: No results for input(s): GLUCAP in the last 168 hours. D-Dimer No results for input(s): DDIMER in the last 72 hours. Hgb A1c No results for input(s): HGBA1C in the last 72 hours. Lipid Profile No results for input(s): CHOL, HDL, LDLCALC, TRIG, CHOLHDL, LDLDIRECT in the last 72 hours. Thyroid function studies No results for input(s): TSH, T4TOTAL, T3FREE, THYROIDAB in the last 72 hours.  Invalid input(s): FREET3 Anemia work up No results for input(s): VITAMINB12, FOLATE, FERRITIN, TIBC, IRON, RETICCTPCT in the last 72 hours. Urinalysis    Component Value Date/Time   COLORURINE YELLOW 09/01/2016 1544   APPEARANCEUR CLOUDY (A) 09/01/2016 1544   LABSPEC >1.046 (H) 09/01/2016 1544   PHURINE 5.0 09/01/2016 1544   GLUCOSEU  NEGATIVE 09/01/2016 1544   HGBUR NEGATIVE 09/01/2016 1544   BILIRUBINUR NEGATIVE 09/01/2016 1544   KETONESUR NEGATIVE 09/01/2016 1544   PROTEINUR 30 (A) 09/01/2016 1544   NITRITE NEGATIVE 09/01/2016 1544   LEUKOCYTESUR LARGE (A) 09/01/2016 1544   Sepsis Labs Invalid input(s): PROCALCITONIN,  WBC,  LACTICIDVEN Microbiology No results found for this or any previous visit (from the past 240 hour(s)).   Time coordinating discharge: Over 30 minutes  SIGNED:   Kathie Dike, MD  [facility] 09/03/2016, 6:43 PM Pager   If 7PM-7AM, please contact night-coverage [email or web address] Password [email or web address]

## 2016-09-03 NOTE — Progress Notes (Signed)
[facility] rep called back requesting copy of H&P or discharge summary to accompany home O2 order and saturation note. MD has not yet completed d/c summary. H&P, most recent progress note from Dr. Roderic Palau and Dr Luan Pulling faxed to [facility] for home O2 diagnosis confirmation. Called [facility], spoke with [place] who confirmed receipt of faxed documents. Requested [facility] rep call to notify us when O2 will be delivered since patient and family ready for d/c home. Donavan Foil, RN

## 2016-09-03 NOTE — Progress Notes (Signed)
Discharge instructions were reviewed with patient and family at bedside via teachback this afternoon. Given AVS, prescriptions sent to his pharmacy per MD. Patient and family verbalized understanding of instructions, f/u information, when to call MD. Denies pain or discomfort at this time. Pt aware waiting on home O2 to be delivered to room from [facility] for discharge home. Family remains at bedside. Pt in stable condition awaiting discharge. Donavan Foil, RN

## 2016-10-03 ENCOUNTER — Encounter: Payer: Self-pay | Admitting: *Deleted

## 2016-10-03 NOTE — Progress Notes (Signed)
Cardiology Office Note  Date: 10/04/2016   ID: [name], DOB [birth year], MRN [id number]  PCP: Glenda Chroman, MD  Primary Cardiologist: Rozann Lesches, MD   Chief Complaint  Patient presents with  . Coronary Artery Disease    History of Present Illness: [name] is a 83 y.o. male last seen in November 2017. Record review finds recent hospitalization at [facility] in April due to cough and shortness of breath. He was found to have a lingular mass by chest CT although in the setting of community acquired pneumonia, treated with antibiotics and steroids with plan for follow-up imaging at a later date. He is here with his son today for a follow-up visit. Wearing supplemental oxygen via nasal cannula. States that he feels reasonably well, no chest pain, no cough or hemoptysis. He sees Dr. Luan Pulling soon and then will have a follow-up chest CT. He reports a good appetite, has had no weight loss.  We continue medical therapy and conservative management of his ischemic cardiomyopathy and CAD. Current medications are outlined below. Cardiac regimen includes aspirin, Norvasc, Lasix, lisinopril, Toprol-XL, and Zocor. He has not required and nitroglycerin recently.  Past Medical History:  Diagnosis Date  . Carotid artery disease (HCC)    Mild  . Coronary atherosclerosis of native coronary artery   . Essential hypertension, benign   . Ischemic cardiomyopathy    LVEF 40-45%  . Mixed hyperlipidemia   . Myocardial infarction [facility])    Remote NSTEMI  . PVD (peripheral vascular disease) ([place])     Past Surgical History:  Procedure Laterality Date  . CORONARY ARTERY BYPASS GRAFT  07/2003    Dr. Roxy Manns: LIMA to LAD, SVG to diagonal and OM, SVG to PDA  . FEMORAL HERNIA REPAIR      Current Outpatient Prescriptions  Medication Sig Dispense Refill  . albuterol (PROVENTIL HFA;VENTOLIN HFA) 108 (90 Base) MCG/ACT inhaler Inhale 2 puffs into the lungs every 6 (six) hours as needed for  wheezing or shortness of breath. 1 Inhaler 2  . amLODipine (NORVASC) 5 MG tablet Take 1 tablet (5 mg total) by mouth daily. 45 tablet 6  . aspirin 325 MG tablet Take 162 mg by mouth daily.    . Dutasteride-Tamsulosin HCl (JALYN) 0.5-0.4 MG CAPS Take 1 tablet by mouth daily.    . furosemide (LASIX) 20 MG tablet Take 20 mg by mouth daily.    Marland Kitchen guaiFENesin (MUCINEX) 600 MG 12 hr tablet Take 1 tablet (600 mg total) by mouth 2 (two) times daily. 30 tablet 0  . lisinopril (PRINIVIL,ZESTRIL) 20 MG tablet Take 20 mg by mouth daily.    . metoprolol succinate (TOPROL XL) 25 MG 24 hr tablet Take 1 tablet (25 mg total) by mouth daily. 90 tablet 3  . Multiple Vitamins-Minerals (CENTRUM SILVER PO) Take 1 tablet by mouth daily.    . nitroGLYCERIN (NITROSTAT) 0.4 MG SL tablet Place 0.4 mg under the tongue every 5 (five) minutes as needed for chest pain.    . simvastatin (ZOCOR) 10 MG tablet TAKE 0.5 TABLETS (5 MG TOTAL) BY MOUTH EVERY EVENING. 15 tablet 6  . tamsulosin (FLOMAX) 0.4 MG CAPS capsule Take 1 capsule by mouth daily.     No current facility-administered medications for this visit.    Allergies:  Patient has no known allergies.   Social History: The patient  reports that he quit smoking about 38 years ago. His smoking use included Cigarettes and Pipe. He started smoking about 89  years ago. He has a 5.00 pack-year smoking history. He has never used smokeless tobacco. He reports that he does not drink alcohol or use drugs.   ROS:  Please see the history of present illness. Otherwise, complete review of systems is positive for hearing loss.  All other systems are reviewed and negative.   Physical Exam: VS:  BP 124/63   Pulse (!) 58   Ht '5\' 11"'$  (1.803 m)   Wt 157 lb (71.2 kg)   SpO2 96% Comment: on 3L O2 via [place]  BMI 21.90 kg/m , BMI Body mass index is 21.9 kg/m.  Wt Readings from Last 3 Encounters:  10/04/16 157 lb (71.2 kg)  09/01/16 154 lb 8.7 oz (70.1 kg)  08/02/16 161 lb (73 kg)      Elderly male, wearing supplemental oxygen via nasal cannula. HEENT: Conjunctiva and lids normal, oropharynx clear.  Neck: Supple, no elevated JVP or bruits.  Lungs: Few scattered rhonchi, no murmur.  Cardiac: Regular rate and rhythm, soft systolic murmur at the base, preserved second heart sound, no S3  Abdomen: Soft, nontender, bowel sounds present.  Extremities: Trace ankle edema. Skin: Warm and dry. Musculoskeletal: No kyphosis or Neuropsychiatric: Alert and oriented 3, affect appropriate.  ECG: I personally reviewed the tracing from 09/01/2016 which showed sinus rhythm with PACs, left anterior fascicular block.  Recent Labwork: 09/01/2016: B Natriuretic Peptide 588.0 09/03/2016: BUN 31; Creatinine, Ser 1.28; Hemoglobin 12.1; Platelets 401; Potassium 3.9; Sodium 141  Other Studies Reviewed Today:  Echocardiogram [facility]) October 2015: Mild LVH with LVEF 40-45%, distal anterior/anteroseptal hypokinesis to akinesis, diastolic dysfunction, mild to moderate mitral regurgitation, trace aortic regurgitation, mild tricuspid regurgitation with RVSP 40-50 mm mercury.  Assessment and Plan:  1. Symptomatically stable CAD status post CABG in 2005. He remains comfortable continuing with medical therapy and observation. No recent angina symptoms to require nitroglycerin use.  2. Ischemic cardiomyopathy with LVEF 40-45%. Weight is stable on current dose of Lasix. Also remains on beta blocker and ACE inhibitor.  3. Recent diagnosis of community-acquired pneumonia with left lingular mass also visualized. He has follow-up pending with Dr. Luan Pulling and will have repeat chest CT imaging. He is still on oxygen supplementation at home.  4. Essential hypertension, blood pressure is well controlled today on current regimen. No changes were made.  Current medicines were reviewed with the patient today.  Disposition: Follow-up in 6 months.  Signed, Satira Sark, MD,  [facility] 10/04/2016 11:33 AM    [facility] [place], [place], [place] [address] Phone: [phone]; Fax: [phone]

## 2016-10-04 ENCOUNTER — Encounter: Payer: Self-pay | Admitting: Cardiology

## 2016-10-04 ENCOUNTER — Ambulatory Visit (INDEPENDENT_AMBULATORY_CARE_PROVIDER_SITE_OTHER): Payer: Medicare Other | Admitting: Cardiology

## 2016-10-04 VITALS — BP 124/63 | HR 58 | Ht 71.0 in | Wt 157.0 lb

## 2016-10-04 DIAGNOSIS — I10 Essential (primary) hypertension: Secondary | ICD-10-CM

## 2016-10-04 DIAGNOSIS — I255 Ischemic cardiomyopathy: Secondary | ICD-10-CM

## 2016-10-04 DIAGNOSIS — Z8701 Personal history of pneumonia (recurrent): Secondary | ICD-10-CM | POA: Diagnosis not present

## 2016-10-04 DIAGNOSIS — I2510 Atherosclerotic heart disease of native coronary artery without angina pectoris: Secondary | ICD-10-CM | POA: Diagnosis not present

## 2016-10-04 NOTE — Patient Instructions (Signed)

## 2016-10-28 ENCOUNTER — Other Ambulatory Visit: Payer: Self-pay

## 2016-10-28 MED ORDER — METOPROLOL SUCCINATE ER 25 MG PO TB24: 25.0000 mg | ORAL_TABLET | Freq: Every day | ORAL | 3 refills | Status: AC

## 2017-01-07 ENCOUNTER — Other Ambulatory Visit: Payer: Self-pay | Admitting: Cardiology

## 2017-02-12 ENCOUNTER — Other Ambulatory Visit: Payer: Self-pay | Admitting: Cardiology

## 2017-02-24 ENCOUNTER — Inpatient Hospital Stay (HOSPITAL_COMMUNITY)
Admission: EM | Admit: 2017-02-24 | Discharge: 2017-02-25 | DRG: 194 | Disposition: A | Discharge status: Hospice | Payer: Medicare Other | Attending: Family Medicine | Admitting: Family Medicine

## 2017-02-24 ENCOUNTER — Other Ambulatory Visit: Payer: Self-pay

## 2017-02-24 ENCOUNTER — Emergency Department (HOSPITAL_COMMUNITY): Payer: Medicare Other

## 2017-02-24 ENCOUNTER — Encounter (HOSPITAL_COMMUNITY): Payer: Self-pay | Admitting: Emergency Medicine

## 2017-02-24 DIAGNOSIS — J159 Unspecified bacterial pneumonia: Principal | ICD-10-CM | POA: Diagnosis present

## 2017-02-24 DIAGNOSIS — I739 Peripheral vascular disease, unspecified: Secondary | ICD-10-CM | POA: Diagnosis present

## 2017-02-24 DIAGNOSIS — C3490 Malignant neoplasm of unspecified part of unspecified bronchus or lung: Secondary | ICD-10-CM | POA: Diagnosis present

## 2017-02-24 DIAGNOSIS — K5909 Other constipation: Secondary | ICD-10-CM | POA: Diagnosis present

## 2017-02-24 DIAGNOSIS — I110 Hypertensive heart disease with heart failure: Secondary | ICD-10-CM | POA: Diagnosis present

## 2017-02-24 DIAGNOSIS — E876 Hypokalemia: Secondary | ICD-10-CM | POA: Diagnosis present

## 2017-02-24 DIAGNOSIS — Z87891 Personal history of nicotine dependence: Secondary | ICD-10-CM

## 2017-02-24 DIAGNOSIS — R0602 Shortness of breath: Secondary | ICD-10-CM | POA: Diagnosis not present

## 2017-02-24 DIAGNOSIS — R0603 Acute respiratory distress: Secondary | ICD-10-CM | POA: Diagnosis present

## 2017-02-24 DIAGNOSIS — Z23 Encounter for immunization: Secondary | ICD-10-CM | POA: Diagnosis present

## 2017-02-24 DIAGNOSIS — I252 Old myocardial infarction: Secondary | ICD-10-CM

## 2017-02-24 DIAGNOSIS — I444 Left anterior fascicular block: Secondary | ICD-10-CM | POA: Diagnosis present

## 2017-02-24 DIAGNOSIS — N400 Enlarged prostate without lower urinary tract symptoms: Secondary | ICD-10-CM | POA: Diagnosis present

## 2017-02-24 DIAGNOSIS — Z6820 Body mass index (BMI) 20.0-20.9, adult: Secondary | ICD-10-CM | POA: Diagnosis not present

## 2017-02-24 DIAGNOSIS — J189 Pneumonia, unspecified organism: Secondary | ICD-10-CM | POA: Diagnosis not present

## 2017-02-24 DIAGNOSIS — Z66 Do not resuscitate: Secondary | ICD-10-CM | POA: Diagnosis present

## 2017-02-24 DIAGNOSIS — R54 Age-related physical debility: Secondary | ICD-10-CM | POA: Diagnosis present

## 2017-02-24 DIAGNOSIS — Z515 Encounter for palliative care: Secondary | ICD-10-CM | POA: Diagnosis not present

## 2017-02-24 DIAGNOSIS — I5042 Chronic combined systolic (congestive) and diastolic (congestive) heart failure: Secondary | ICD-10-CM | POA: Diagnosis present

## 2017-02-24 DIAGNOSIS — Z951 Presence of aortocoronary bypass graft: Secondary | ICD-10-CM

## 2017-02-24 DIAGNOSIS — J61 Pneumoconiosis due to asbestos and other mineral fibers: Secondary | ICD-10-CM | POA: Diagnosis present

## 2017-02-24 DIAGNOSIS — I255 Ischemic cardiomyopathy: Secondary | ICD-10-CM | POA: Diagnosis present

## 2017-02-24 DIAGNOSIS — E782 Mixed hyperlipidemia: Secondary | ICD-10-CM | POA: Diagnosis present

## 2017-02-24 DIAGNOSIS — I2510 Atherosclerotic heart disease of native coronary artery without angina pectoris: Secondary | ICD-10-CM | POA: Diagnosis present

## 2017-02-24 DIAGNOSIS — J440 Chronic obstructive pulmonary disease with acute lower respiratory infection: Secondary | ICD-10-CM | POA: Diagnosis present

## 2017-02-24 DIAGNOSIS — J441 Chronic obstructive pulmonary disease with (acute) exacerbation: Secondary | ICD-10-CM | POA: Diagnosis not present

## 2017-02-24 DIAGNOSIS — Z7982 Long term (current) use of aspirin: Secondary | ICD-10-CM

## 2017-02-24 DIAGNOSIS — Z7189 Other specified counseling: Secondary | ICD-10-CM

## 2017-02-24 DIAGNOSIS — D649 Anemia, unspecified: Secondary | ICD-10-CM | POA: Diagnosis not present

## 2017-02-24 DIAGNOSIS — D72829 Elevated white blood cell count, unspecified: Secondary | ICD-10-CM | POA: Diagnosis present

## 2017-02-24 DIAGNOSIS — R918 Other nonspecific abnormal finding of lung field: Secondary | ICD-10-CM | POA: Diagnosis not present

## 2017-02-24 DIAGNOSIS — J9611 Chronic respiratory failure with hypoxia: Secondary | ICD-10-CM | POA: Diagnosis present

## 2017-02-24 DIAGNOSIS — Z79899 Other long term (current) drug therapy: Secondary | ICD-10-CM

## 2017-02-24 DIAGNOSIS — I10 Essential (primary) hypertension: Secondary | ICD-10-CM | POA: Diagnosis present

## 2017-02-24 HISTORY — DX: Malignant (primary) neoplasm, unspecified: C80.1

## 2017-02-24 LAB — COMPREHENSIVE METABOLIC PANEL
ALBUMIN: 3.1 g/dL — AB (ref 3.5–5.0)
ALK PHOS: 67 U/L (ref 38–126)
ALT: 22 U/L (ref 17–63)
ANION GAP: 8 (ref 5–15)
AST: 22 U/L (ref 15–41)
BILIRUBIN TOTAL: 0.7 mg/dL (ref 0.3–1.2)
BUN: 17 mg/dL (ref 6–20)
CALCIUM: 8.9 mg/dL (ref 8.9–10.3)
CO2: 34 mmol/L — ABNORMAL HIGH (ref 22–32)
Chloride: 100 mmol/L — ABNORMAL LOW (ref 101–111)
Creatinine, Ser: 0.96 mg/dL (ref 0.61–1.24)
GFR calc Af Amer: >60 mL/min (ref >60)
GLUCOSE: 139 mg/dL — AB (ref 65–99)
POTASSIUM: 2.9 mmol/L — AB (ref 3.5–5.1)
Sodium: 142 mmol/L (ref 135–145)
TOTAL PROTEIN: 6.5 g/dL (ref 6.5–8.1)

## 2017-02-24 LAB — URINALYSIS, ROUTINE W REFLEX MICROSCOPIC
BILIRUBIN URINE: NEGATIVE
Glucose, UA: NEGATIVE mg/dL
HGB URINE DIPSTICK: NEGATIVE
Ketones, ur: NEGATIVE mg/dL
LEUKOCYTES UA: NEGATIVE
NITRITE: NEGATIVE
PROTEIN: NEGATIVE mg/dL
Specific Gravity, Urine: 1.015 (ref 1.005–1.030)
pH: 5 (ref 5.0–8.0)

## 2017-02-24 LAB — CBC WITH DIFFERENTIAL/PLATELET
BASOS ABS: 0 10*3/uL (ref 0.0–0.1)
BASOS PCT: 0 %
EOS PCT: 1 %
Eosinophils Absolute: 0.1 10*3/uL (ref 0.0–0.7)
HCT: 37.1 % — ABNORMAL LOW (ref 39.0–52.0)
Hemoglobin: 11.3 g/dL — ABNORMAL LOW (ref 13.0–17.0)
LYMPHS PCT: 6 %
Lymphs Abs: 0.7 10*3/uL (ref 0.7–4.0)
MCH: 27.2 pg (ref 26.0–34.0)
MCHC: 30.5 g/dL (ref 30.0–36.0)
MCV: 89.4 fL (ref 78.0–100.0)
MONO ABS: 1.1 10*3/uL — AB (ref 0.1–1.0)
Monocytes Relative: 9 %
NEUTROS ABS: 10.6 10*3/uL — AB (ref 1.7–7.7)
Neutrophils Relative %: 84 %
PLATELETS: 405 10*3/uL — AB (ref 150–400)
RBC: 4.15 MIL/uL — AB (ref 4.22–5.81)
RDW: 13.8 % (ref 11.5–15.5)
WBC: 12.5 10*3/uL — AB (ref 4.0–10.5)

## 2017-02-24 LAB — LACTIC ACID, PLASMA
LACTIC ACID, VENOUS: 1.1 mmol/L (ref 0.5–1.9)
LACTIC ACID, VENOUS: 0.8 mmol/L (ref 0.5–1.9)

## 2017-02-24 MED ORDER — DEXTROSE 5 % IV SOLN
1.0000 g | INTRAVENOUS | Status: DC
  Filled 2017-02-24 (×3): qty 10

## 2017-02-24 MED ORDER — NITROGLYCERIN 0.4 MG SL SUBL
0.4000 mg | SUBLINGUAL_TABLET | SUBLINGUAL | Status: DC | PRN
Start: 2017-02-24 — End: 2017-02-25

## 2017-02-24 MED ORDER — METHYLPREDNISOLONE SODIUM SUCC 40 MG IJ SOLR
40.0000 mg | Freq: Three times a day (TID) | INTRAMUSCULAR | Status: DC
  Administered 2017-02-24 – 2017-02-25 (×3): 40 mg via INTRAVENOUS
  Filled 2017-02-24 (×3): qty 1

## 2017-02-24 MED ORDER — IPRATROPIUM-ALBUTEROL 0.5-2.5 (3) MG/3ML IN SOLN: 3.0000 mL | RESPIRATORY_TRACT | Status: DC

## 2017-02-24 MED ORDER — AMLODIPINE BESYLATE 5 MG PO TABS
5.0000 mg | ORAL_TABLET | Freq: Every day | ORAL | Status: DC
  Administered 2017-02-25: 5 mg via ORAL
  Filled 2017-02-24: qty 1

## 2017-02-24 MED ORDER — DUTASTERIDE 0.5 MG PO CAPS
0.5000 mg | ORAL_CAPSULE | Freq: Every day | ORAL | Status: DC
  Administered 2017-02-25: 0.5 mg via ORAL
  Filled 2017-02-24 (×3): qty 1

## 2017-02-24 MED ORDER — LISINOPRIL 10 MG PO TABS
20.0000 mg | ORAL_TABLET | Freq: Every day | ORAL | Status: DC
  Administered 2017-02-25: 20 mg via ORAL
  Filled 2017-02-24: qty 2

## 2017-02-24 MED ORDER — UMECLIDINIUM BROMIDE 62.5 MCG/INH IN AEPB
1.0000 | INHALATION_SPRAY | Freq: Every day | RESPIRATORY_TRACT | Status: DC
  Administered 2017-02-25: 09:00:00 1 via RESPIRATORY_TRACT
  Filled 2017-02-24: qty 7

## 2017-02-24 MED ORDER — AZITHROMYCIN 250 MG PO TABS
500.0000 mg | ORAL_TABLET | Freq: Once | ORAL | Status: AC
  Administered 2017-02-24: 500 mg via ORAL
  Filled 2017-02-24: qty 2

## 2017-02-24 MED ORDER — TAMSULOSIN HCL 0.4 MG PO CAPS
0.4000 mg | ORAL_CAPSULE | Freq: Every day | ORAL | Status: DC
  Administered 2017-02-25: 0.4 mg via ORAL
  Filled 2017-02-24: qty 1

## 2017-02-24 MED ORDER — POTASSIUM CHLORIDE 10 MEQ/100ML IV SOLN
10.0000 meq | Freq: Once | INTRAVENOUS | Status: AC
  Administered 2017-02-24: 10 meq via INTRAVENOUS
  Filled 2017-02-24: qty 100

## 2017-02-24 MED ORDER — ENSURE ENLIVE PO LIQD: 237.0000 mL | Freq: Two times a day (BID) | ORAL | Status: DC

## 2017-02-24 MED ORDER — POTASSIUM CHLORIDE CRYS ER 20 MEQ PO TBCR
40.0000 meq | EXTENDED_RELEASE_TABLET | Freq: Once | ORAL | Status: AC
  Administered 2017-02-24: 40 meq via ORAL
  Filled 2017-02-24: qty 2

## 2017-02-24 MED ORDER — CEFTRIAXONE SODIUM 1 G IJ SOLR
1.0000 g | Freq: Once | INTRAMUSCULAR | Status: AC
  Administered 2017-02-24: 1 g via INTRAVENOUS
  Filled 2017-02-24: qty 10

## 2017-02-24 MED ORDER — ALBUTEROL (5 MG/ML) CONTINUOUS INHALATION SOLN
10.0000 mg/h | INHALATION_SOLUTION | RESPIRATORY_TRACT | Status: AC
  Filled 2017-02-24: qty 20

## 2017-02-24 MED ORDER — METOPROLOL SUCCINATE ER 25 MG PO TB24
25.0000 mg | ORAL_TABLET | Freq: Every day | ORAL | Status: DC
  Administered 2017-02-25: 25 mg via ORAL
  Filled 2017-02-24: qty 1

## 2017-02-24 MED ORDER — DUTASTERIDE-TAMSULOSIN HCL 0.5-0.4 MG PO CAPS: 1.0000 | ORAL_CAPSULE | Freq: Every day | ORAL | Status: DC

## 2017-02-24 MED ORDER — AZITHROMYCIN 250 MG PO TABS
500.0000 mg | ORAL_TABLET | ORAL | Status: DC
  Administered 2017-02-25: 500 mg via ORAL
  Filled 2017-02-24: qty 2

## 2017-02-24 MED ORDER — ENOXAPARIN SODIUM 40 MG/0.4ML ~~LOC~~ SOLN
40.0000 mg | SUBCUTANEOUS | Status: DC
  Administered 2017-02-24: 40 mg via SUBCUTANEOUS
  Filled 2017-02-24: qty 0.4

## 2017-02-24 MED ORDER — FLUTICASONE FUROATE-VILANTEROL 100-25 MCG/INH IN AEPB
1.0000 | INHALATION_SPRAY | Freq: Every day | RESPIRATORY_TRACT | Status: DC
  Administered 2017-02-25: 1 via RESPIRATORY_TRACT
  Filled 2017-02-24: qty 28

## 2017-02-24 MED ORDER — GUAIFENESIN ER 600 MG PO TB12
600.0000 mg | ORAL_TABLET | Freq: Two times a day (BID) | ORAL | Status: DC
  Administered 2017-02-24 – 2017-02-25 (×2): 600 mg via ORAL
  Filled 2017-02-24 (×2): qty 1

## 2017-02-24 MED ORDER — POTASSIUM CHLORIDE IN NACL 20-0.9 MEQ/L-% IV SOLN
INTRAVENOUS | Status: DC
  Administered 2017-02-24: 17:00:00 via INTRAVENOUS

## 2017-02-24 MED ORDER — POLYETHYLENE GLYCOL 3350 17 G PO PACK
17.0000 g | PACK | Freq: Two times a day (BID) | ORAL | Status: DC
  Administered 2017-02-24 – 2017-02-25 (×2): 17 g via ORAL
  Filled 2017-02-24 (×2): qty 1

## 2017-02-24 MED ORDER — ASPIRIN 325 MG PO TABS
162.0000 mg | ORAL_TABLET | Freq: Every day | ORAL | Status: DC
  Administered 2017-02-25: 162 mg via ORAL
  Filled 2017-02-24: qty 1

## 2017-02-24 MED ORDER — ORAL CARE MOUTH RINSE
15.0000 mL | Freq: Two times a day (BID) | OROMUCOSAL | Status: DC
  Administered 2017-02-24: 15 mL via OROMUCOSAL

## 2017-02-24 MED ORDER — IPRATROPIUM-ALBUTEROL 0.5-2.5 (3) MG/3ML IN SOLN
3.0000 mL | Freq: Four times a day (QID) | RESPIRATORY_TRACT | Status: DC
  Administered 2017-02-24 – 2017-02-25 (×3): 3 mL via RESPIRATORY_TRACT
  Filled 2017-02-24 (×2): qty 3

## 2017-02-24 MED ORDER — INFLUENZA VAC SPLIT HIGH-DOSE 0.5 ML IM SUSY
0.5000 mL | PREFILLED_SYRINGE | INTRAMUSCULAR | Status: AC
  Administered 2017-02-25: 0.5 mL via INTRAMUSCULAR
  Filled 2017-02-24: qty 0.5

## 2017-02-24 MED ORDER — FLUTICASONE-UMECLIDIN-VILANT 100-62.5-25 MCG/INH IN AEPB: 1.0000 | INHALATION_SPRAY | Freq: Every morning | RESPIRATORY_TRACT | Status: DC

## 2017-02-24 NOTE — H&P (Signed)
History and Physical  [name] IRC:[id number] DOB: 02/12/1918 DOA: 02/24/2017  Referring physician: Sabra Heck, MD PCP: Glenda Chroman, MD  Pulmonologist: Luan Pulling  Chief Complaint: worsening SOB  HPI: [name] is a 83 y.o. male veteran with history of lung cancer, COPD and asbestosis recently started on home oxygen presented to ED with symptoms of progressive SOB and weakness over past several days associated with cough and fatigue.  He was recently admitted in April 2018 during which time the patient was found to have multiple pulmonary infiltrates, some of these were thought to be asbestos-related, infectious related or possibly oncologic in origin. He elected not to have a follow-up CT scan and stated that he did not want to pursue that at this time. He has been in his usual state of health until last week when Dr. Luan Pulling who is the patient's pulmonologist put him on oxygen for persistent hypoxia but the family notes that over the last couple of days to the last week he has had increased shortness of breath, increased work of breathing, severe dyspnea on exertion and a productive cough of phlegm. There is been no swelling of the legs, no fevers. He is using his oxygen but off the oxygen he drops very quickly down into the 70% range and on his oxygen he is in the 80% range.  He is feeling a little better with treatment in the ED but with his co-morbidities admission was requested for inpatient treatment of his pneumonia.    ED course: patient noted to be hypoxic and chest xray shows multifocal pneumonia.    Review of Systems: All systems reviewed and apart from history of presenting illness, are negative.  Past Medical History:  Diagnosis Date  . Cancer ([place])    lung cancer  . Carotid artery disease (HCC)    Mild  . Coronary atherosclerosis of native coronary artery   . Essential hypertension, benign   . Ischemic cardiomyopathy    LVEF 40-45%  . Mixed hyperlipidemia   .  Myocardial infarction [facility])    Remote NSTEMI  . PVD (peripheral vascular disease) ([place])    Past Surgical History:  Procedure Laterality Date  . CORONARY ARTERY BYPASS GRAFT  07/2003    Dr. Roxy Manns: LIMA to LAD, SVG to diagonal and OM, SVG to PDA  . FEMORAL HERNIA REPAIR     Social History:  reports that he quit smoking about 38 years ago. His smoking use included Cigarettes and Pipe. He started smoking about 75 years ago. He has a 5.00 pack-year smoking history. He has never used smokeless tobacco. He reports that he does not drink alcohol or use drugs.  No Known Allergies  Family History  Problem Relation Age of Onset  . Coronary artery disease Unknown        Family h/o    Prior to Admission medications   Medication Sig Start Date End Date Taking? Authorizing Provider  amLODipine (NORVASC) 5 MG tablet Take 1 tablet (5 mg total) by mouth daily. 09/03/16  Yes Kathie Dike, MD  aspirin 325 MG tablet Take 162 mg by mouth daily.   Yes [provider]  Dutasteride-Tamsulosin HCl (JALYN) 0.5-0.4 MG CAPS Take 1 tablet by mouth daily.   Yes [provider]  furosemide (LASIX) 20 MG tablet Take 20 mg by mouth daily.   Yes [provider]  guaiFENesin (MUCINEX) 600 MG 12 hr tablet Take 1 tablet (600 mg total) by mouth 2 (two) times daily. 09/03/16  Yes Kathie Dike, MD  lisinopril (PRINIVIL,ZESTRIL) 20 MG tablet Take 20 mg by mouth daily.   Yes [provider]  metoprolol succinate (TOPROL XL) 25 MG 24 hr tablet Take 1 tablet (25 mg total) by mouth daily. 10/28/16 10/28/17 Yes Satira Sark, MD  Multiple Vitamins-Minerals (CENTRUM SILVER PO) Take 1 tablet by mouth daily.   Yes [provider]  simvastatin (ZOCOR) 10 MG tablet TAKE 0.5 TABLETS (5 MG TOTAL) BY MOUTH EVERY EVENING. 02/13/17  Yes Satira Sark, MD  TRELEGY ELLIPTA 100-62.5-25 MCG/INH AEPB Take 1 Dose by mouth every morning. 02/15/17  Yes [provider]  albuterol (PROVENTIL  HFA;VENTOLIN HFA) 108 (90 Base) MCG/ACT inhaler Inhale 2 puffs into the lungs every 6 (six) hours as needed for wheezing or shortness of breath. Patient not taking: Reported on 02/24/2017 09/03/16   Kathie Dike, MD  nitroGLYCERIN (NITROSTAT) 0.4 MG SL tablet Place 0.4 mg under the tongue every 5 (five) minutes as needed for chest pain.    Satira Sark, MD   Physical Exam: Vitals:   02/24/17 1147 02/24/17 1310 02/24/17 1328 02/24/17 1330  BP: 132/80 (!) 148/72  (!) 145/91  Pulse: (!) 58 94 (!) 48 (!) 54  Resp: 18  (!) 21 (!) 28  Temp: 98.3 F (36.8 C)     TempSrc: Oral     SpO2: 90% (!) 78% 97% 100%  Weight: 67.1 kg (148 lb)     Height: 5\' 11"  (1.803 m)        General exam: Moderately built and emaciated patient, lying comfortably supine on the gurney in no obvious distress.  Head, eyes and ENT: Nontraumatic and normocephalic. Pupils equally reacting to light and accommodation. Oral mucosa very dry.  Neck: Supple. No JVD, carotid bruit or thyromegaly.  Lymphatics: No lymphadenopathy.  Respiratory system: bilateral insp/exp wheezes heard.  Cardiovascular system: S1 and S2 heard, normal. No JVD, murmurs, gallops, clicks or pedal edema.  Gastrointestinal system: Abdomen is nondistended, soft and nontender. Normal bowel sounds heard. No organomegaly or masses appreciated.  Central nervous system: Alert and oriented. No focal neurological deficits.  Extremities: Symmetric 5 x 5 power. Peripheral pulses symmetrically felt.   Skin: No rashes or acute findings.  Musculoskeletal system: Negative exam.  Psychiatry: Pleasant and cooperative.  Labs on Admission:  Basic Metabolic Panel:  Recent Labs Lab 02/24/17 1241  NA 142  K 2.9*  CL 100*  CO2 34*  GLUCOSE 139*  BUN 17  CREATININE 0.96  CALCIUM 8.9   Liver Function Tests:  Recent Labs Lab 02/24/17 1241  AST 22  ALT 22  ALKPHOS 67  BILITOT 0.7  PROT 6.5  ALBUMIN 3.1*   No results for input(s): LIPASE,  AMYLASE in the last 168 hours. No results for input(s): AMMONIA in the last 168 hours. CBC:  Recent Labs Lab 02/24/17 1241  WBC 12.5*  NEUTROABS 10.6*  HGB 11.3*  HCT 37.1*  MCV 89.4  PLT 405*   Cardiac Enzymes: No results for input(s): CKTOTAL, CKMB, CKMBINDEX, TROPONINI in the last 168 hours.  BNP (last 3 results) No results for input(s): PROBNP in the last 8760 hours. CBG: No results for input(s): GLUCAP in the last 168 hours.  Radiological Exams on Admission: Dg Chest 2 View  Result Date: 02/24/2017 CLINICAL DATA:  Shortness of breath . EXAM: CHEST  2 VIEW COMPARISON:  CT 09/01/2016.  Chest x-ray 09/01/2016. FINDINGS: Prior CABG. Cardiomegaly with normal pulmonary vascularity. Bilateral pulmonary infiltrates are noted. Infiltrates have a  nodular component. Active granulomas disease cannot be excluded. Persistent density left upper lung as noted on prior CT report 09/01/2016. A follow-up CT is suggested to further evaluate these findings. Bibasilar bronchiectasis. No prominent pleural effusion. No pneumothorax. Prior CABG. Stable Heart size.  No acute bony abnormality. IMPRESSION: 1. Bilateral pulmonary infiltrates with nodularity. Infectious etiologies should be considered. Active granulomas disease cannot be excluded. 2. Persistent density left upper lobe. Underlying malignancy cannot be excluded as noted on prior CT of 09/01/2016. A repeat chest CT is suggested to further evaluate these findings. Bibasilar bronchiectasis again noted. 3. Prior CABG.  Heart size stable . Electronically Signed   By: Marcello Moores  Register   On: 02/24/2017 12:20   EKG: Independently reviewed.   Assessment/Plan Principal Problem:   Community acquired bacterial pneumonia Active Problems:   Mixed hyperlipidemia   Essential hypertension, benign   CORONARY ATHEROSCLEROSIS NATIVE CORONARY ARTERY   Shortness of breath   Chronic combined systolic and diastolic congestive heart failure (HCC)   Mass of  lingula of lung   PVD (peripheral vascular disease) (HCC)   COPD with acute exacerbation (HCC)   BPH (benign prostatic hyperplasia)   Hypokalemia   Leukocytosis   Anemia, unspecified  1. Acute respiratory distress secondary to community-acquired pneumonia-admit for IV antibiotics and supportive care, follow blood cultures, supplemental oxygen, respiratory panel testing. 2. Chronic respiratory failure-continuous oxygen supplementation. 3. Lung cancer-patient elected not to pursue further diagnosis and treatment of this. I'm asking for palliative medicine consult as I am concerned that his life expectancy is less than 6 months. He could benefit from palliative hospice treatment. 4. COPD with acute exacerbation-IV steroids ordered and IV antibiotics as above. Schedule nebulizer treatments. 5. Chronic constipation-MiraLAX twice a day ordered for symptom relief. 6. BPH-resume home medications. 7. Leukocytosis-likely secondary to acute pneumonia infection, monitor closely. 8. Hypokalemia-replacing IV and orally. Check magnesium and follow BMPs. 9. Anemia unspecified-monitor CBC daily and follow. 10. Essential hypertension-monitor blood pressures closely. Resume home medications when appropriate.  DVT Prophylaxis: lovenox Code Status: DNR  Family Communication: bedside  Disposition Plan: TBD   Time spent: 54 mins  Irwin Brakeman, MD [facility] Pager [phone]  If 7PM-7AM, please contact night-coverage [email or web address] Password [email or web address] 02/24/2017, 2:07 PM

## 2017-02-24 NOTE — ED Provider Notes (Signed)
[facility] DEPT Provider Note   CSN: [id number] Arrival date & time: 02/24/17  1141     History   Chief Complaint Chief Complaint  Patient presents with  . Shortness of Breath    HPI [name] is a 83 y.o. male.  HPI  The patient is a 83 year old male, history of likely lung cancer, history of ischemic cardiomyopathy with an ejection fraction of 40%, history of myocardial infarction, peripheral actually her disease and hypertension. He was recently admitted in April 2018 during which time the patient was found to have multiple pulmonary infiltrates, some of these were thought to be asbestos-related, infectious related or possibly oncologic in origin. He elected not to have a follow-up CT scan and stated that he did not want to pursue that at this time. He has been in his usual state of health until last week when Dr. Luan Pulling who is the patient's pulmonologist put him on oxygen for persistent hypoxia but the family notes that over the last couple of days to the last week he has had increased shortness of breath, increased work of breathing, severe dyspnea on exertion and a productive cough of phlegm. There is been no swelling of the legs, no fevers. He is using his oxygen but off the oxygen he drops very quickly down into the 70% range and on his oxygen he is in the 80% range.  Past Medical History:  Diagnosis Date  . Cancer ([place])    lung cancer  . Carotid artery disease (HCC)    Mild  . Coronary atherosclerosis of native coronary artery   . Essential hypertension, benign   . Ischemic cardiomyopathy    LVEF 40-45%  . Mixed hyperlipidemia   . Myocardial infarction [facility])    Remote NSTEMI  . PVD (peripheral vascular disease) [facility])     Patient Active Problem List   Diagnosis Date Noted  . Community acquired bacterial pneumonia 02/24/2017  . COPD with acute exacerbation ([place]) 09/02/2016  . Mass of lingula of lung 09/01/2016  . Mass of lingula of lung 09/01/2016  . PVD  (peripheral vascular disease) ([place]) 09/01/2016  . Chronic combined systolic and diastolic congestive heart failure ([place]) 04/22/2014  . Shortness of breath 03/11/2014  . Mixed hyperlipidemia 12/10/2009  . Essential hypertension, benign 12/10/2009  . CORONARY ATHEROSCLEROSIS NATIVE CORONARY ARTERY 12/10/2009    Past Surgical History:  Procedure Laterality Date  . CORONARY ARTERY BYPASS GRAFT  07/2003    Dr. Roxy Manns: LIMA to LAD, SVG to diagonal and OM, SVG to PDA  . FEMORAL HERNIA REPAIR         Home Medications    Prior to Admission medications   Medication Sig Start Date End Date Taking? Authorizing Provider  amLODipine (NORVASC) 5 MG tablet Take 1 tablet (5 mg total) by mouth daily. 09/03/16  Yes Kathie Dike, MD  aspirin 325 MG tablet Take 162 mg by mouth daily.   Yes [provider]  furosemide (LASIX) 20 MG tablet Take 20 mg by mouth daily.   Yes [provider]  lisinopril (PRINIVIL,ZESTRIL) 20 MG tablet Take 20 mg by mouth daily.   Yes [provider]  metoprolol succinate (TOPROL XL) 25 MG 24 hr tablet Take 1 tablet (25 mg total) by mouth daily. 10/28/16 10/28/17 Yes Satira Sark, MD  Multiple Vitamins-Minerals (CENTRUM SILVER PO) Take 1 tablet by mouth daily.   Yes [provider]  simvastatin (ZOCOR) 10 MG tablet TAKE 0.5 TABLETS (5 MG TOTAL) BY MOUTH EVERY  EVENING. 02/13/17  Yes Satira Sark, MD  albuterol (PROVENTIL HFA;VENTOLIN HFA) 108 (90 Base) MCG/ACT inhaler Inhale 2 puffs into the lungs every 6 (six) hours as needed for wheezing or shortness of breath. Patient not taking: Reported on 02/24/2017 09/03/16   Kathie Dike, MD  Dutasteride-Tamsulosin HCl (JALYN) 0.5-0.4 MG CAPS Take 1 tablet by mouth daily.    [provider]  guaiFENesin (MUCINEX) 600 MG 12 hr tablet Take 1 tablet (600 mg total) by mouth 2 (two) times daily. 09/03/16   Kathie Dike, MD  nitroGLYCERIN (NITROSTAT) 0.4 MG SL tablet Place 0.4 mg under the  tongue every 5 (five) minutes as needed for chest pain.    Satira Sark, MD  tamsulosin (FLOMAX) 0.4 MG CAPS capsule Take 1 capsule by mouth daily. 09/30/16   [provider]    Family History Family History  Problem Relation Age of Onset  . Coronary artery disease Unknown        Family h/o    Social History Social History  Substance Use Topics  . Smoking status: Former Smoker    Packs/day: 0.50    Years: 10.00    Types: Cigarettes, Pipe    Start date: 01/15/1942    Quit date: 05/30/1978  . Smokeless tobacco: Never Used     Comment: smoked a pipe the first 8 years then just cigarettes   . Alcohol use No     Allergies   Patient has no known allergies.   Review of Systems Review of Systems  All other systems reviewed and are negative.    Physical Exam Updated Vital Signs BP (!) 145/91   Pulse (!) 54   Temp 98.3 F (36.8 C) (Oral)   Resp (!) 28   Ht 5\' 11"  (1.803 m)   Wt 67.1 kg (148 lb)   SpO2 100%   BMI 20.64 kg/m   Physical Exam  Constitutional: He appears well-developed and well-nourished. He appears distressed.  HENT:  Head: Normocephalic and atraumatic.  Mouth/Throat: Oropharynx is clear and moist. No oropharyngeal exudate.  Eyes: Pupils are equal, round, and reactive to light. Conjunctivae and EOM are normal. Right eye exhibits no discharge. Left eye exhibits no discharge. No scleral icterus.  Neck: Normal range of motion. Neck supple. No JVD present. No thyromegaly present.  Cardiovascular: Normal rate, regular rhythm, normal heart sounds and intact distal pulses.  Exam reveals no gallop and no friction rub.   No murmur heard. Pulmonary/Chest: He is in respiratory distress. He has wheezes. He has rales.  Mild tachypnea, increased WOB  Abdominal: Soft. Bowel sounds are normal. He exhibits no distension and no mass. There is no tenderness.  Musculoskeletal: Normal range of motion. He exhibits no edema or tenderness.  Lymphadenopathy:    He  has no cervical adenopathy.  Neurological: He is alert. Coordination normal.  Skin: Skin is warm and dry. No rash noted. No erythema.  Psychiatric: He has a normal mood and affect. His behavior is normal.  Nursing note and vitals reviewed.    ED Treatments / Results  Labs (all labs ordered are listed, but only abnormal results are displayed) Labs Reviewed  COMPREHENSIVE METABOLIC PANEL - Abnormal; Notable for the following:       Result Value   Potassium 2.9 (*)    Chloride 100 (*)    CO2 34 (*)    Glucose, Bld 139 (*)    Albumin 3.1 (*)    All other components within normal limits  CBC WITH  DIFFERENTIAL/PLATELET - Abnormal; Notable for the following:    WBC 12.5 (*)    RBC 4.15 (*)    Hemoglobin 11.3 (*)    HCT 37.1 (*)    Platelets 405 (*)    Neutro Abs 10.6 (*)    Monocytes Absolute 1.1 (*)    All other components within normal limits  LACTIC ACID, PLASMA  URINALYSIS, ROUTINE W REFLEX MICROSCOPIC  LACTIC ACID, PLASMA    EKG  EKG Interpretation  Date/Time:  Friday February 24 2017 13:28:28 EDT Ventricular Rate:  49 PR Interval:    QRS Duration: 106 QT Interval:  458 QTC Calculation: 414 R Axis:   -63 Text Interpretation:  Bradycardia with irregular rate Left anterior fascicular block Abnormal R-wave progression, early transition LVH with secondary repolarization abnormality Anterior Q waves, possibly due to LVH Since last tracing rate slower Confirmed by Noemi Chapel [phone]) on 02/24/2017 1:32:09 PM       Radiology Dg Chest 2 View  Result Date: 02/24/2017 CLINICAL DATA:  Shortness of breath . EXAM: CHEST  2 VIEW COMPARISON:  CT 09/01/2016.  Chest x-ray 09/01/2016. FINDINGS: Prior CABG. Cardiomegaly with normal pulmonary vascularity. Bilateral pulmonary infiltrates are noted. Infiltrates have a nodular component. Active granulomas disease cannot be excluded. Persistent density left upper lung as noted on prior CT report 09/01/2016. A follow-up CT is suggested to  further evaluate these findings. Bibasilar bronchiectasis. No prominent pleural effusion. No pneumothorax. Prior CABG. Stable Heart size.  No acute bony abnormality. IMPRESSION: 1. Bilateral pulmonary infiltrates with nodularity. Infectious etiologies should be considered. Active granulomas disease cannot be excluded. 2. Persistent density left upper lobe. Underlying malignancy cannot be excluded as noted on prior CT of 09/01/2016. A repeat chest CT is suggested to further evaluate these findings. Bibasilar bronchiectasis again noted. 3. Prior CABG.  Heart size stable . Electronically Signed   By: Marcello Moores  Register   On: 02/24/2017 12:20    Procedures Procedures (including critical care time)  Medications Ordered in ED Medications  cefTRIAXone (ROCEPHIN) 1 g in dextrose 5 % 50 mL IVPB (1 g Intravenous New Bag/Given 02/24/17 1352)  albuterol (PROVENTIL,VENTOLIN) solution continuous neb (not administered)  potassium chloride 10 mEq in 100 mL IVPB (not administered)  azithromycin (ZITHROMAX) tablet 500 mg (500 mg Oral Given 02/24/17 1352)     Initial Impression / Assessment and Plan / ED Course  I have reviewed the triage vital signs and the nursing notes.  Pertinent labs & imaging results that were available during my care of the patient were reviewed by me and considered in my medical decision making (see chart for details).     The x-ray reveals bilateral pulmonary infiltrates with a persistent left upper lobe density  Antibiotics have been ordered  Laboratory workup shows a leukocytosis  Will need to be admitted for community-acquired pneumonia in the setting of an increased oxygen requirement and dyspnea on exertion.  D/w Dr. Wynetta Emery who will admit - I appreciate his willingness to care for Mr. Fenn.  Continuous albuterol ordered.  Lactic acid normal WBC 12.5 No hypotension and no fevers  Final Clinical Impressions(s) / ED Diagnoses   Final diagnoses:  Community acquired  pneumonia, unspecified laterality  Hypokalemia    New Prescriptions New Prescriptions   No medications on file     Noemi Chapel, MD 02/24/17 1355

## 2017-02-24 NOTE — ED Triage Notes (Signed)
Pt and family report pt has been increasingly short of breath over the past few days with progressive weakness.  Sats have been hard to maintain and have been dropping into the 70s with home oxygen when he walks.

## 2017-02-24 NOTE — Progress Notes (Signed)
Nutrition Brief Note  Chart reviewed. Patient presents with acute respiratory failure and has pneumonia. He has a hx of lung cancer, CAD, and MI.  The patient has talked with palliative care and has chosen to go home with Hospice Services.  No further nutrition interventions warranted at this time.  Please re-consult as needed.   Colman Cater MS,RD,CSG,LDN Office: [phone] Pager: [phone]

## 2017-02-24 NOTE — Consult Note (Signed)
Consultation Note Date: 02/24/2017   Patient Name: [name]  DOB: 1917-12-09  MRN: [id number]  Age / Sex: 83 y.o., male  PCP: Glenda Chroman, MD Referring Physician: Murlean Iba, MD  Reason for Consultation: Establishing goals of care, Hospice Evaluation and Psychosocial/spiritual support  HPI/Patient Profile: 83 y.o. male  with past medical history of lung cancer April of this year without workup, carotid artery disease CAD, PVD, history of MI admitted on 02/24/2017 with likely pneumonia.   Clinical Assessment and Goals of Care: [name] is resting quietly on the stretcher.He greets me making and keeping eye contact. Present today is stepson, Joesphine Bare and his wife Marcie Bal. Osvaldo Human and I go to another room to meet while [name] is having continuous neb.  They tell a story of decline over the las few weeks to months. They share that [name] has lost 12 pounds in the last month.  We talk about what is normal and expected as people near end of life. They share that [name] has been eating a large breakfast, but does not eat much during the day.  The share their concern over his driving, but share how important independence is for him. Hey share that he has a chair placed between the car and the home, he is unable to walk that distance without stopping. They share that he does not endorse shortness of breath.   They share that he did not want to come to the hospital this time.They share that his desire is to pass away at home, his wife died in 05-04-23 with hospice. Family is agreeable to home hospice.  We returned to see [name]. We talk about what is important to him. We talk about how we can best support him. He agrees to in-home hospice services.   Healthcare power of attorney NEXT OF KIN - stepson, Joesphine Bare. Wife deceased 05/03/2016 with hospice, no  children.   SUMMARY OF RECOMMENDATIONS   Overnight stay for observation and anti biotics. Home with the benefits of [place] for in-home care.  Code Status/Advance Care Planning:  DNR  Symptom Management:   Per hospitalist, no additional needs at this time.  We discussed the use of morphine  For breathlessness in the future per hospice.   Palliative Prophylaxis:   Turn Reposition  Additional Recommendations (Limitations, Scope, Preferences):  treat the treatable but no CPR or intubation  Psycho-social/Spiritual:   Desire for further Chaplaincy support:no  Additional Recommendations: Caregiving  Support/Resources and Education on Hospice  Prognosis:   < 3 months, would not be surprising based on 12 pound weight loss in 1 month, frailty, functional decline, likely lung cancer without seeking testing or treatment.   Discharge Planning: home with the benefit of hospice of  [facility]      Primary Diagnoses: Present on Admission: . Community acquired bacterial pneumonia . Shortness of breath . PVD (peripheral vascular disease) ([place]) . Mass of lingula of lung . Mixed hyperlipidemia . CORONARY  ATHEROSCLEROSIS NATIVE CORONARY ARTERY . COPD with acute exacerbation ([place]) . Chronic combined systolic and diastolic congestive heart failure ([place]) . Essential hypertension, benign . BPH (benign prostatic hyperplasia) . Hypokalemia . Leukocytosis . Anemia, unspecified   I have reviewed the medical record, interviewed the patient and family, and examined the patient. The following aspects are pertinent.  Past Medical History:  Diagnosis Date  . Cancer ([place])    lung cancer  . Carotid artery disease (HCC)    Mild  . Coronary atherosclerosis of native coronary artery   . Essential hypertension, benign   . Ischemic cardiomyopathy    LVEF 40-45%  . Mixed hyperlipidemia   . Myocardial infarction [facility])    Remote NSTEMI  . PVD (peripheral  vascular disease) ([place])    Social History   Social History  . Marital status: Married    Spouse name: N/A  . Number of children: N/A  . Years of education: N/A   Social History Main Topics  . Smoking status: Former Smoker    Packs/day: 0.50    Years: 10.00    Types: Cigarettes, Pipe    Start date: 01/15/1942    Quit date: 05/30/1978  . Smokeless tobacco: Never Used     Comment: smoked a pipe the first 8 years then just cigarettes   . Alcohol use No  . Drug use: No  . Sexual activity: Not Asked   Other Topics Concern  . None   Social History Narrative  . None   Family History  Problem Relation Age of Onset  . Coronary artery disease Unknown        Family h/o   Scheduled Meds: Continuous Infusions: . albuterol    . potassium chloride 10 mEq (02/24/17 1431)   PRN Meds:. Medications Prior to Admission:  Prior to Admission medications   Medication Sig Start Date End Date Taking? Authorizing Provider  amLODipine (NORVASC) 5 MG tablet Take 1 tablet (5 mg total) by mouth daily. 09/03/16  Yes Kathie Dike, MD  aspirin 325 MG tablet Take 162 mg by mouth daily.   Yes [provider]  Dutasteride-Tamsulosin HCl (JALYN) 0.5-0.4 MG CAPS Take 1 tablet by mouth daily.   Yes [provider]  furosemide (LASIX) 20 MG tablet Take 20 mg by mouth daily.   Yes [provider]  guaiFENesin (MUCINEX) 600 MG 12 hr tablet Take 1 tablet (600 mg total) by mouth 2 (two) times daily. 09/03/16  Yes Kathie Dike, MD  lisinopril (PRINIVIL,ZESTRIL) 20 MG tablet Take 20 mg by mouth daily.   Yes [provider]  metoprolol succinate (TOPROL XL) 25 MG 24 hr tablet Take 1 tablet (25 mg total) by mouth daily. 10/28/16 10/28/17 Yes Satira Sark, MD  Multiple Vitamins-Minerals (CENTRUM SILVER PO) Take 1 tablet by mouth daily.   Yes [provider]  simvastatin (ZOCOR) 10 MG tablet TAKE 0.5 TABLETS (5 MG TOTAL) BY MOUTH EVERY EVENING. 02/13/17  Yes Satira Sark, MD  TRELEGY ELLIPTA 100-62.5-25 MCG/INH AEPB Take 1 Dose by mouth every morning. 02/15/17  Yes [provider]  albuterol (PROVENTIL HFA;VENTOLIN HFA) 108 (90 Base) MCG/ACT inhaler Inhale 2 puffs into the lungs every 6 (six) hours as needed for wheezing or shortness of breath. Patient not taking: Reported on 02/24/2017 09/03/16   Kathie Dike, MD  nitroGLYCERIN (NITROSTAT) 0.4 MG SL tablet Place 0.4 mg under the tongue every 5 (five) minutes as needed for chest pain.    Satira Sark, MD  No Known Allergies Review of Systems  Unable to perform ROS: Acuity of condition    Physical Exam  Constitutional: He is oriented to person, place, and time. No distress.  Week and frail, makes and keeps eye contact, calm and  And cooperative, pleasant  HENT:  Head: Atraumatic.  Cardiovascular: Normal rate and regular rhythm.   Pulmonary/Chest:  Productive cough, work of breathing  Noted,continuous neb at this time  Abdominal: Soft. He exhibits no distension.  Musculoskeletal: He exhibits no edema.  Neurological: He is alert and oriented to person, place, and time.  Skin: Skin is warm and dry.  Nursing note and vitals reviewed.   Vital Signs: BP 125/89   Pulse (!) 48   Temp 98.3 F (36.8 C) (Oral)   Resp 19   Ht 5\' 11"  (1.803 m)   Wt 67.1 kg (148 lb)   SpO2 100%   BMI 20.64 kg/m          SpO2: SpO2: 100 % O2 Device:SpO2: 100 % O2 Flow Rate: .O2 Flow Rate (L/min): 3 L/min  IO: Intake/output summary:  Intake/Output Summary (Last 24 hours) at 02/24/17 1457 Last data filed at 02/24/17 1422  Gross per 24 hour  Intake               50 ml  Output                0 ml  Net               50 ml    LBM:   Baseline Weight: Weight: 67.1 kg (148 lb) Most recent weight: Weight: 67.1 kg (148 lb)     Palliative Assessment/Data:     Time In:  1500 Time Out: 1610 Time Total: 70 minutes Greater than 50%  of this time was spent counseling and coordinating care  related to the above assessment and plan.  Signed by: Drue Novel, NP   Please contact Palliative Medicine Team phone at [phone] for questions and concerns.  For individual provider: See Shea Evans

## 2017-02-25 DIAGNOSIS — I739 Peripheral vascular disease, unspecified: Secondary | ICD-10-CM

## 2017-02-25 DIAGNOSIS — J189 Pneumonia, unspecified organism: Secondary | ICD-10-CM

## 2017-02-25 LAB — CBC WITH DIFFERENTIAL/PLATELET
Basophils Absolute: 0 10*3/uL (ref 0.0–0.1)
Basophils Relative: 0 %
EOS ABS: 0 10*3/uL (ref 0.0–0.7)
Eosinophils Relative: 0 %
HEMATOCRIT: 32.7 % — AB (ref 39.0–52.0)
HEMOGLOBIN: 10.2 g/dL — AB (ref 13.0–17.0)
LYMPHS ABS: 0.5 10*3/uL — AB (ref 0.7–4.0)
LYMPHS PCT: 5 %
MCH: 27.3 pg (ref 26.0–34.0)
MCHC: 31.2 g/dL (ref 30.0–36.0)
MCV: 87.7 fL (ref 78.0–100.0)
MONOS PCT: 5 %
Monocytes Absolute: 0.4 10*3/uL (ref 0.1–1.0)
NEUTROS PCT: 90 %
Neutro Abs: 8.6 10*3/uL — ABNORMAL HIGH (ref 1.7–7.7)
Platelets: 369 10*3/uL (ref 150–400)
RBC: 3.73 MIL/uL — ABNORMAL LOW (ref 4.22–5.81)
RDW: 13.9 % (ref 11.5–15.5)
WBC: 9.5 10*3/uL (ref 4.0–10.5)

## 2017-02-25 LAB — COMPREHENSIVE METABOLIC PANEL
ALBUMIN: 2.7 g/dL — AB (ref 3.5–5.0)
ALT: 21 U/L (ref 17–63)
ANION GAP: 10 (ref 5–15)
AST: 18 U/L (ref 15–41)
Alkaline Phosphatase: 57 U/L (ref 38–126)
BUN: 16 mg/dL (ref 6–20)
CALCIUM: 8.9 mg/dL (ref 8.9–10.3)
CHLORIDE: 105 mmol/L (ref 101–111)
CO2: 29 mmol/L (ref 22–32)
Creatinine, Ser: 0.83 mg/dL (ref 0.61–1.24)
GFR calc Af Amer: >60 mL/min (ref >60)
GFR calc non Af Amer: >60 mL/min (ref >60)
GLUCOSE: 167 mg/dL — AB (ref 65–99)
Potassium: 3.8 mmol/L (ref 3.5–5.1)
SODIUM: 144 mmol/L (ref 135–145)
Total Bilirubin: 0.6 mg/dL (ref 0.3–1.2)
Total Protein: 5.9 g/dL — ABNORMAL LOW (ref 6.5–8.1)

## 2017-02-25 LAB — MAGNESIUM: Magnesium: 1.9 mg/dL (ref 1.7–2.4)

## 2017-02-25 MED ORDER — POLYETHYLENE GLYCOL 3350 17 G PO PACK: 17.0000 g | PACK | Freq: Every day | ORAL | 0 refills | Status: AC

## 2017-02-25 MED ORDER — ALBUTEROL SULFATE HFA 108 (90 BASE) MCG/ACT IN AERS: 2.0000 | INHALATION_SPRAY | Freq: Four times a day (QID) | RESPIRATORY_TRACT | 0 refills | Status: AC | PRN

## 2017-02-25 MED ORDER — PREDNISONE 20 MG PO TABS: ORAL_TABLET | ORAL | 0 refills | Status: AC

## 2017-02-25 MED ORDER — DOXYCYCLINE HYCLATE 100 MG PO CAPS: 100.0000 mg | ORAL_CAPSULE | Freq: Two times a day (BID) | ORAL | 0 refills | Status: AC

## 2017-02-25 MED ORDER — ENSURE ENLIVE PO LIQD: 237.0000 mL | Freq: Two times a day (BID) | ORAL | 0 refills | Status: AC

## 2017-02-25 NOTE — Care Management Note (Addendum)
Case Management Note  Patient Details  Name: [name] MRN: [id number] Date of Birth: [birth year]  Subjective/Objective:           Spoke with relative to confirm choice of [facility]. Family familiar with their services as they cared for the patient's wife in the past. Patient has oxygen at home prior to admission through [facility]. Family states they will provide transportation home via private car and will have oxygen for transport brought in from home to use. Patient uses [facility] at home, family declines needs for additional DME. Currently patient lives at home alone, he declines family's offers to stay with him, so family checks on him multiple times a day and will be available for assistance as well if needed.    [place] Relative [phone]  [phone]          Action/Plan:  Referral accepted by [place], faxed info to [phone]. Start of care in next 24-48 hours.   Expected Discharge Date:  02/25/17               Expected Discharge Plan:  Home w Hospice Care  In-House Referral:     Discharge planning Services  CM Consult  Post Acute Care Choice:    Choice offered to:  Adult Children  DME Arranged:    DME Agency:     HH Arranged:    HH Agency:  [facility]  Status of Service:  Completed, signed off  If discussed at [place] of Stay Meetings, dates discussed:    Additional Comments:  Carles Collet, RN 02/25/2017, 10:41 AM

## 2017-02-25 NOTE — Discharge Summary (Signed)
Physician Discharge Summary  [name]:[id number] DOB: [birth year] DOA: 02/24/2017  PCP: Glenda Chroman, MD  Admit date: 02/24/2017 Discharge date: 02/25/2017  Admitted From: Home  Disposition: Home with support of hospice of [facility]  Recommendations for Outpatient Follow-up:  1. Follow up with PCP in 1 weeks  Discharge Condition: STABLE   CODE STATUS: DNR   Brief Hospitalization Summary: Please see all hospital notes, images, labs for full details of the hospitalization.  HPI: [name] is a 83 y.o. male veteran with history of lung cancer, COPD and asbestosis recently started on home oxygen presented to ED with symptoms of progressive SOB and weakness over past several days associated with cough and fatigue.  He was recently admitted in April 2018 during which time the patient was found to have multiple pulmonary infiltrates, some of these were thought to be asbestos-related, infectious related or possibly oncologic in origin. He elected not to have a follow-up CT scan and stated that he did not want to pursue that at this time. He has been in his usual state of health until last week when Dr. Luan Pulling who is the patient's pulmonologist put him on oxygen for persistent hypoxia but the family notes that over the last couple of days to the last week he has had increased shortness of breath, increased work of breathing, severe dyspnea on exertion and a productive cough of phlegm. There is been no swelling of the legs, no fevers. He is using his oxygen but off the oxygen he drops very quickly down into the 70% range and on his oxygen he is in the 80% range.  He is feeling a little better with treatment in the ED but with his co-morbidities admission was requested for inpatient treatment of his pneumonia.    ED course: patient noted to be hypoxic and chest xray shows multifocal pneumonia.  1. Acute respiratory distress secondary to community-acquired pneumonia-admit for IV  antibiotics and supportive care.  He responded very well and feeling a lot better and would like to discharge home.  He has decided to accept home hospice care and was seen by palliative medicine.  Care manager consulted to arrange.   2. Chronic respiratory failure-continuous oxygen supplementation. 3. Lung cancer-patient elected not to pursue further diagnosis and treatment of this.  Palliative medicine consulted and patient decided to accept home hospice benefits.  Life expectancy less than 3 months given lung cancer, asbestosis and worsening Chronic respiratory failure.  4. COPD with acute exacerbation-home on doxycycline, prednisone taper.  5. Chronic constipation-MiraLAX ordered for symptom relief. 6. BPH-resume home medications. 7. Leukocytosis-likely secondary to acute pneumonia infection and steroids. 8. Hypokalemia-repleted.  9. Anemia unspecified-stable.  10. Essential hypertension-Resume home medications when appropriate.  DVT Prophylaxis: lovenox Code Status: DNR  Family Communication: bedside  Disposition Plan: Home with hospice services   Discharge Diagnoses:  Principal Problem:   Community acquired pneumonia Active Problems:   Mixed hyperlipidemia   Essential hypertension, benign   CORONARY ATHEROSCLEROSIS NATIVE CORONARY ARTERY   Shortness of breath   Chronic combined systolic and diastolic congestive heart failure (HCC)   Mass of lingula of lung   PVD (peripheral vascular disease) ([place])   COPD with acute exacerbation (HCC)   BPH (benign prostatic hyperplasia)   Hypokalemia   Leukocytosis   Anemia, unspecified   Goals of care, counseling/discussion   Palliative care encounter   Encounter for hospice care discussion  Discharge Instructions:  Allergies as of 02/25/2017   No Known  Allergies     Medication List    TAKE these medications   albuterol 108 (90 Base) MCG/ACT inhaler Commonly known as:  PROVENTIL HFA;VENTOLIN HFA Inhale 2 puffs into the lungs  every 6 (six) hours as needed for wheezing or shortness of breath.   amLODipine 5 MG tablet Commonly known as:  NORVASC Take 1 tablet (5 mg total) by mouth daily.   aspirin 325 MG tablet Take 162 mg by mouth daily.   CENTRUM SILVER PO Take 1 tablet by mouth daily.   doxycycline 100 MG capsule Commonly known as:  VIBRAMYCIN Take 1 capsule (100 mg total) by mouth 2 (two) times daily.   feeding supplement (ENSURE ENLIVE) Liqd Take 237 mLs by mouth 2 (two) times daily between meals.   furosemide 20 MG tablet Commonly known as:  LASIX Take 20 mg by mouth daily.   guaiFENesin 600 MG 12 hr tablet Commonly known as:  MUCINEX Take 1 tablet (600 mg total) by mouth 2 (two) times daily.   JALYN 0.5-0.4 MG Caps Generic drug:  Dutasteride-Tamsulosin HCl Take 1 tablet by mouth daily.   lisinopril 20 MG tablet Commonly known as:  PRINIVIL,ZESTRIL Take 20 mg by mouth daily.   metoprolol succinate 25 MG 24 hr tablet Commonly known as:  TOPROL XL Take 1 tablet (25 mg total) by mouth daily.   nitroGLYCERIN 0.4 MG SL tablet Commonly known as:  NITROSTAT Place 0.4 mg under the tongue every 5 (five) minutes as needed for chest pain.   polyethylene glycol packet Commonly known as:  MIRALAX / GLYCOLAX Take 17 g by mouth daily.   predniSONE 20 MG tablet Commonly known as:  DELTASONE Take 3 PO QAM x3days, 2 PO QAM x3days, 1 PO QAM x3days   simvastatin 10 MG tablet Commonly known as:  ZOCOR TAKE 0.5 TABLETS (5 MG TOTAL) BY MOUTH EVERY EVENING.   TRELEGY ELLIPTA 100-62.5-25 MCG/INH Aepb Generic drug:  Fluticasone-Umeclidin-Vilant Take 1 Dose by mouth every morning.            Discharge Care Instructions        Start     Ordered   02/25/17 0000  albuterol (PROVENTIL HFA;VENTOLIN HFA) 108 (90 Base) MCG/ACT inhaler  Every 6 hours PRN     02/25/17 0956   02/25/17 0000  feeding supplement, ENSURE ENLIVE, (ENSURE ENLIVE) LIQD  2 times daily between meals     02/25/17 0956    02/25/17 0000  polyethylene glycol (MIRALAX / GLYCOLAX) packet  Daily     02/25/17 0956   02/25/17 0000  doxycycline (VIBRAMYCIN) 100 MG capsule  2 times daily     02/25/17 0956   02/25/17 0000  predniSONE (DELTASONE) 20 MG tablet     02/25/17 5621     Follow-up Information    Jerene Bears B, MD. Schedule an appointment as soon as possible for a visit in 1 week(s).   Specialty:  Internal Medicine Contact information: [place] [address] [phone]          No Known Allergies Current Discharge Medication List    START taking these medications   Details  doxycycline (VIBRAMYCIN) 100 MG capsule Take 1 capsule (100 mg total) by mouth 2 (two) times daily. Qty: 14 capsule, Refills: 0    feeding supplement, ENSURE ENLIVE, (ENSURE ENLIVE) LIQD Take 237 mLs by mouth 2 (two) times daily between meals. Qty: 6636 mL, Refills: 0    polyethylene glycol (MIRALAX / GLYCOLAX) packet Take 17 g  by mouth daily. Qty: 30 packet, Refills: 0    predniSONE (DELTASONE) 20 MG tablet Take 3 PO QAM x3days, 2 PO QAM x3days, 1 PO QAM x3days Qty: 18 tablet, Refills: 0      CONTINUE these medications which have CHANGED   Details  albuterol (PROVENTIL HFA;VENTOLIN HFA) 108 (90 Base) MCG/ACT inhaler Inhale 2 puffs into the lungs every 6 (six) hours as needed for wheezing or shortness of breath. Qty: 1 Inhaler, Refills: 0      CONTINUE these medications which have NOT CHANGED   Details  amLODipine (NORVASC) 5 MG tablet Take 1 tablet (5 mg total) by mouth daily. Qty: 45 tablet, Refills: 6    aspirin 325 MG tablet Take 162 mg by mouth daily.    Dutasteride-Tamsulosin HCl (JALYN) 0.5-0.4 MG CAPS Take 1 tablet by mouth daily.    furosemide (LASIX) 20 MG tablet Take 20 mg by mouth daily.    guaiFENesin (MUCINEX) 600 MG 12 hr tablet Take 1 tablet (600 mg total) by mouth 2 (two) times daily. Qty: 30 tablet, Refills: 0    lisinopril (PRINIVIL,ZESTRIL) 20 MG tablet Take 20 mg by mouth daily.     metoprolol succinate (TOPROL XL) 25 MG 24 hr tablet Take 1 tablet (25 mg total) by mouth daily. Qty: 90 tablet, Refills: 3    Multiple Vitamins-Minerals (CENTRUM SILVER PO) Take 1 tablet by mouth daily.    simvastatin (ZOCOR) 10 MG tablet TAKE 0.5 TABLETS (5 MG TOTAL) BY MOUTH EVERY EVENING. Qty: 15 tablet, Refills: 6    TRELEGY ELLIPTA 100-62.5-25 MCG/INH AEPB Take 1 Dose by mouth every morning.    nitroGLYCERIN (NITROSTAT) 0.4 MG SL tablet Place 0.4 mg under the tongue every 5 (five) minutes as needed for chest pain.       Procedures/Studies: Dg Chest 2 View  Result Date: 02/24/2017 CLINICAL DATA:  Shortness of breath . EXAM: CHEST  2 VIEW COMPARISON:  CT 09/01/2016.  Chest x-ray 09/01/2016. FINDINGS: Prior CABG. Cardiomegaly with normal pulmonary vascularity. Bilateral pulmonary infiltrates are noted. Infiltrates have a nodular component. Active granulomas disease cannot be excluded. Persistent density left upper lung as noted on prior CT report 09/01/2016. A follow-up CT is suggested to further evaluate these findings. Bibasilar bronchiectasis. No prominent pleural effusion. No pneumothorax. Prior CABG. Stable Heart size.  No acute bony abnormality. IMPRESSION: 1. Bilateral pulmonary infiltrates with nodularity. Infectious etiologies should be considered. Active granulomas disease cannot be excluded. 2. Persistent density left upper lobe. Underlying malignancy cannot be excluded as noted on prior CT of 09/01/2016. A repeat chest CT is suggested to further evaluate these findings. Bibasilar bronchiectasis again noted. 3. Prior CABG.  Heart size stable . Electronically Signed   By: Marcello Moores  Register   On: 02/24/2017 12:20      Subjective: Pt says he feels much better, he is eating and drinking well.    Discharge Exam: Vitals:   02/25/17 0857 02/25/17 0859  BP:    Pulse:    Resp:    Temp:    SpO2: 90% 90%   Vitals:   02/25/17 0624 02/25/17 0850 02/25/17 0857 02/25/17 0859  BP:  (!) 166/73     Pulse: (!) 57     Resp: 20     Temp: 97.9 F (36.6 C)     TempSrc: Oral     SpO2: 99% 90% 90% 90%  Weight:      Height:       General: Pt is alert, awake, not in acute  distress Cardiovascular: normal S1/S2 +, no rubs, no gallops Respiratory: better air movement, coarse rhonchi heard.  Abdominal: Soft, NT, ND, bowel sounds + Extremities: no edema, no cyanosis   The results of significant diagnostics from this hospitalization (including imaging, microbiology, ancillary and laboratory) are listed below for reference.     Microbiology: Recent Results (from the past 240 hour(s))  Culture, blood (routine x 2) Call MD if unable to obtain prior to antibiotics being given     Status: None (Preliminary result)   Collection Time: 02/24/17 12:50 PM  Result Value Ref Range Status   Specimen Description LEFT ANTECUBITAL  Final   Special Requests   Final    BOTTLES DRAWN AEROBIC AND ANAEROBIC Blood Culture results may not be optimal due to an excessive volume of blood received in culture bottles   Culture NO GROWTH < 12 HOURS  Final   Report Status PENDING  Incomplete  Culture, blood (routine x 2) Call MD if unable to obtain prior to antibiotics being given     Status: None (Preliminary result)   Collection Time: 02/24/17 12:50 PM  Result Value Ref Range Status   Specimen Description RIGHT ANTECUBITAL  Final   Special Requests   Final    BOTTLES DRAWN AEROBIC AND ANAEROBIC Blood Culture adequate volume   Culture NO GROWTH < 12 HOURS  Final   Report Status PENDING  Incomplete     Labs: BNP (last 3 results)  Recent Labs  09/01/16 1605  BNP 967.8*   Basic Metabolic Panel:  Recent Labs Lab 02/24/17 1241 02/25/17 0601  NA 142 144  K 2.9* 3.8  CL 100* 105  CO2 34* 29  GLUCOSE 139* 167*  BUN 17 16  CREATININE 0.96 0.83  CALCIUM 8.9 8.9  MG  --  1.9   Liver Function Tests:  Recent Labs Lab 02/24/17 1241 02/25/17 0601  AST 22 18  ALT 22 21  ALKPHOS 67 57   BILITOT 0.7 0.6  PROT 6.5 5.9*  ALBUMIN 3.1* 2.7*   No results for input(s): LIPASE, AMYLASE in the last 168 hours. No results for input(s): AMMONIA in the last 168 hours. CBC:  Recent Labs Lab 02/24/17 1241 02/25/17 0601  WBC 12.5* 9.5  NEUTROABS 10.6* 8.6*  HGB 11.3* 10.2*  HCT 37.1* 32.7*  MCV 89.4 87.7  PLT 405* 369   Cardiac Enzymes: No results for input(s): CKTOTAL, CKMB, CKMBINDEX, TROPONINI in the last 168 hours. BNP: Invalid input(s): POCBNP CBG: No results for input(s): GLUCAP in the last 168 hours. D-Dimer No results for input(s): DDIMER in the last 72 hours. Hgb A1c No results for input(s): HGBA1C in the last 72 hours. Lipid Profile No results for input(s): CHOL, HDL, LDLCALC, TRIG, CHOLHDL, LDLDIRECT in the last 72 hours. Thyroid function studies No results for input(s): TSH, T4TOTAL, T3FREE, THYROIDAB in the last 72 hours.  Invalid input(s): FREET3 Anemia work up No results for input(s): VITAMINB12, FOLATE, FERRITIN, TIBC, IRON, RETICCTPCT in the last 72 hours. Urinalysis    Component Value Date/Time   COLORURINE YELLOW 02/24/2017 1152   APPEARANCEUR CLEAR 02/24/2017 1152   LABSPEC 1.015 02/24/2017 1152   PHURINE 5.0 02/24/2017 1152   GLUCOSEU NEGATIVE 02/24/2017 1152   HGBUR NEGATIVE 02/24/2017 1152   BILIRUBINUR NEGATIVE 02/24/2017 1152   KETONESUR NEGATIVE 02/24/2017 1152   PROTEINUR NEGATIVE 02/24/2017 1152   NITRITE NEGATIVE 02/24/2017 1152   LEUKOCYTESUR NEGATIVE 02/24/2017 1152   Sepsis Labs Invalid input(s): PROCALCITONIN,  WBC,  LACTICIDVEN Microbiology Recent Results (  from the past 240 hour(s))  Culture, blood (routine x 2) Call MD if unable to obtain prior to antibiotics being given     Status: None (Preliminary result)   Collection Time: 02/24/17 12:50 PM  Result Value Ref Range Status   Specimen Description LEFT ANTECUBITAL  Final   Special Requests   Final    BOTTLES DRAWN AEROBIC AND ANAEROBIC Blood Culture results may not be  optimal due to an excessive volume of blood received in culture bottles   Culture NO GROWTH < 12 HOURS  Final   Report Status PENDING  Incomplete  Culture, blood (routine x 2) Call MD if unable to obtain prior to antibiotics being given     Status: None (Preliminary result)   Collection Time: 02/24/17 12:50 PM  Result Value Ref Range Status   Specimen Description RIGHT ANTECUBITAL  Final   Special Requests   Final    BOTTLES DRAWN AEROBIC AND ANAEROBIC Blood Culture adequate volume   Culture NO GROWTH < 12 HOURS  Final   Report Status PENDING  Incomplete   Time coordinating discharge: 34 mins  SIGNED:  Irwin Brakeman, MD  [facility] 02/25/2017, 9:58 AM Pager [phone]  If 7PM-7AM, please contact night-coverage [email or web address] Password [email or web address]

## 2017-02-25 NOTE — Progress Notes (Signed)
[name] discharged Home per MD order.  Discharge instructions reviewed and discussed with the patient, all questions and concerns answered. Copy of instructions and scripts given to patient. Pt going home with home hospice with [facility]. Family knows that Hospice will follow up on Monday to schedule initial assessment as pt has no emergent needs.  Allergies as of 02/25/2017   No Known Allergies     Medication List    TAKE these medications   albuterol 108 (90 Base) MCG/ACT inhaler Commonly known as:  PROVENTIL HFA;VENTOLIN HFA Inhale 2 puffs into the lungs every 6 (six) hours as needed for wheezing or shortness of breath.   amLODipine 5 MG tablet Commonly known as:  NORVASC Take 1 tablet (5 mg total) by mouth daily.   aspirin 325 MG tablet Take 162 mg by mouth daily.   CENTRUM SILVER PO Take 1 tablet by mouth daily.   doxycycline 100 MG capsule Commonly known as:  VIBRAMYCIN Take 1 capsule (100 mg total) by mouth 2 (two) times daily.   feeding supplement (ENSURE ENLIVE) Liqd Take 237 mLs by mouth 2 (two) times daily between meals.   furosemide 20 MG tablet Commonly known as:  LASIX Take 20 mg by mouth daily.   guaiFENesin 600 MG 12 hr tablet Commonly known as:  MUCINEX Take 1 tablet (600 mg total) by mouth 2 (two) times daily.   JALYN 0.5-0.4 MG Caps Generic drug:  Dutasteride-Tamsulosin HCl Take 1 tablet by mouth daily.   lisinopril 20 MG tablet Commonly known as:  PRINIVIL,ZESTRIL Take 20 mg by mouth daily.   metoprolol succinate 25 MG 24 hr tablet Commonly known as:  TOPROL XL Take 1 tablet (25 mg total) by mouth daily.   nitroGLYCERIN 0.4 MG SL tablet Commonly known as:  NITROSTAT Place 0.4 mg under the tongue every 5 (five) minutes as needed for chest pain.   polyethylene glycol packet Commonly known as:  MIRALAX / GLYCOLAX Take 17 g by mouth daily.   predniSONE 20 MG tablet Commonly known as:  DELTASONE Take 3 PO QAM  x3days, 2 PO QAM x3days, 1 PO QAM x3days   simvastatin 10 MG tablet Commonly known as:  ZOCOR TAKE 0.5 TABLETS (5 MG TOTAL) BY MOUTH EVERY EVENING.   TRELEGY ELLIPTA 100-62.5-25 MCG/INH Aepb Generic drug:  Fluticasone-Umeclidin-Vilant Take 1 Dose by mouth every morning.            Discharge Care Instructions        Start     Ordered   02/25/17 0000  albuterol (PROVENTIL HFA;VENTOLIN HFA) 108 (90 Base) MCG/ACT inhaler  Every 6 hours PRN     02/25/17 0956   02/25/17 0000  feeding supplement, ENSURE ENLIVE, (ENSURE ENLIVE) LIQD  2 times daily between meals     02/25/17 0956   02/25/17 0000  polyethylene glycol (MIRALAX / GLYCOLAX) packet  Daily     02/25/17 0956   02/25/17 0000  doxycycline (VIBRAMYCIN) 100 MG capsule  2 times daily     02/25/17 0956   02/25/17 0000  predniSONE (DELTASONE) 20 MG tablet     02/25/17 0956      Patients skin is clean, dry and intact, no evidence of skin break down. IV site discontinued and catheter remains intact. Site without signs and symptoms of complications. Dressing and pressure applied.  Patient escorted to car by NT in a wheelchair,  no distress noted upon discharge.  Ralene Muskrat Leighanna Kirn 02/25/2017 1:11 PM

## 2017-02-25 NOTE — Progress Notes (Signed)
TC made to [employer], CM to inform her of pt's d/c orders to go home with [facility].

## 2017-02-25 NOTE — Discharge Instructions (Signed)
Follow with Primary MD  Glenda Chroman, MD  and other consultant's as instructed your Hospitalist MD  Please get a complete blood count and chemistry panel checked by your Primary MD at your next visit, and again as instructed by your Primary MD.  Get Medicines reviewed and adjusted: Please take all your medications with you for your next visit with your Primary MD  Laboratory/radiological data: Please request your Primary MD to go over all hospital tests and procedure/radiological results at the follow up, please ask your Primary MD to get all Hospital records sent to his/her office.  In some cases, they will be blood work, cultures and biopsy results pending at the time of your discharge. Please request that your primary care M.D. follows up on these results.  Also Note the following: If you experience worsening of your admission symptoms, develop shortness of breath, life threatening emergency, suicidal or homicidal thoughts you must seek medical attention immediately by calling 911 or calling your MD immediately  if symptoms less severe.  You must read complete instructions/literature along with all the possible adverse reactions/side effects for all the Medicines you take and that have been prescribed to you. Take any new Medicines after you have completely understood and accpet all the possible adverse reactions/side effects.   Do not drive when taking Pain medications or sleeping medications (Benzodaizepines)  Do not take more than prescribed Pain, Sleep and Anxiety Medications. It is not advisable to combine anxiety,sleep and pain medications without talking with your primary care practitioner  Special Instructions: If you have smoked or chewed Tobacco  in the last 2 yrs please stop smoking, stop any regular Alcohol  and or any Recreational drug use.  Wear Seat belts while driving.  Please note: You were cared for by a hospitalist during your hospital stay. Once you are discharged,  your primary care physician will handle any further medical issues. Please note that NO REFILLS for any discharge medications will be authorized once you are discharged, as it is imperative that you return to your primary care physician (or establish a relationship with a primary care physician if you do not have one) for your post hospital discharge needs so that they can reassess your need for medications and monitor your lab values.

## 2017-02-26 LAB — HIV ANTIBODY (ROUTINE TESTING W REFLEX): HIV Screen 4th Generation wRfx: NONREACTIVE

## 2017-03-01 LAB — CULTURE, BLOOD (ROUTINE X 2)
Culture: NO GROWTH
Culture: NO GROWTH
SPECIAL REQUESTS: ADEQUATE

## 2017-03-30 DEATH — deceased

## 2017-04-13 ENCOUNTER — Ambulatory Visit: Payer: Medicare Other | Admitting: Cardiology

## 2019-02-15 IMAGING — DX DG KNEE COMPLETE 4+V*L*
4 series · 4 of 4 positions shown · non-contrast
Comparison: None.

CLINICAL DATA: Fell yesterday landing on the left knee

EXAM:
LEFT KNEE - COMPLETE 4+ VIEW

[knee ap (1 of 3)]
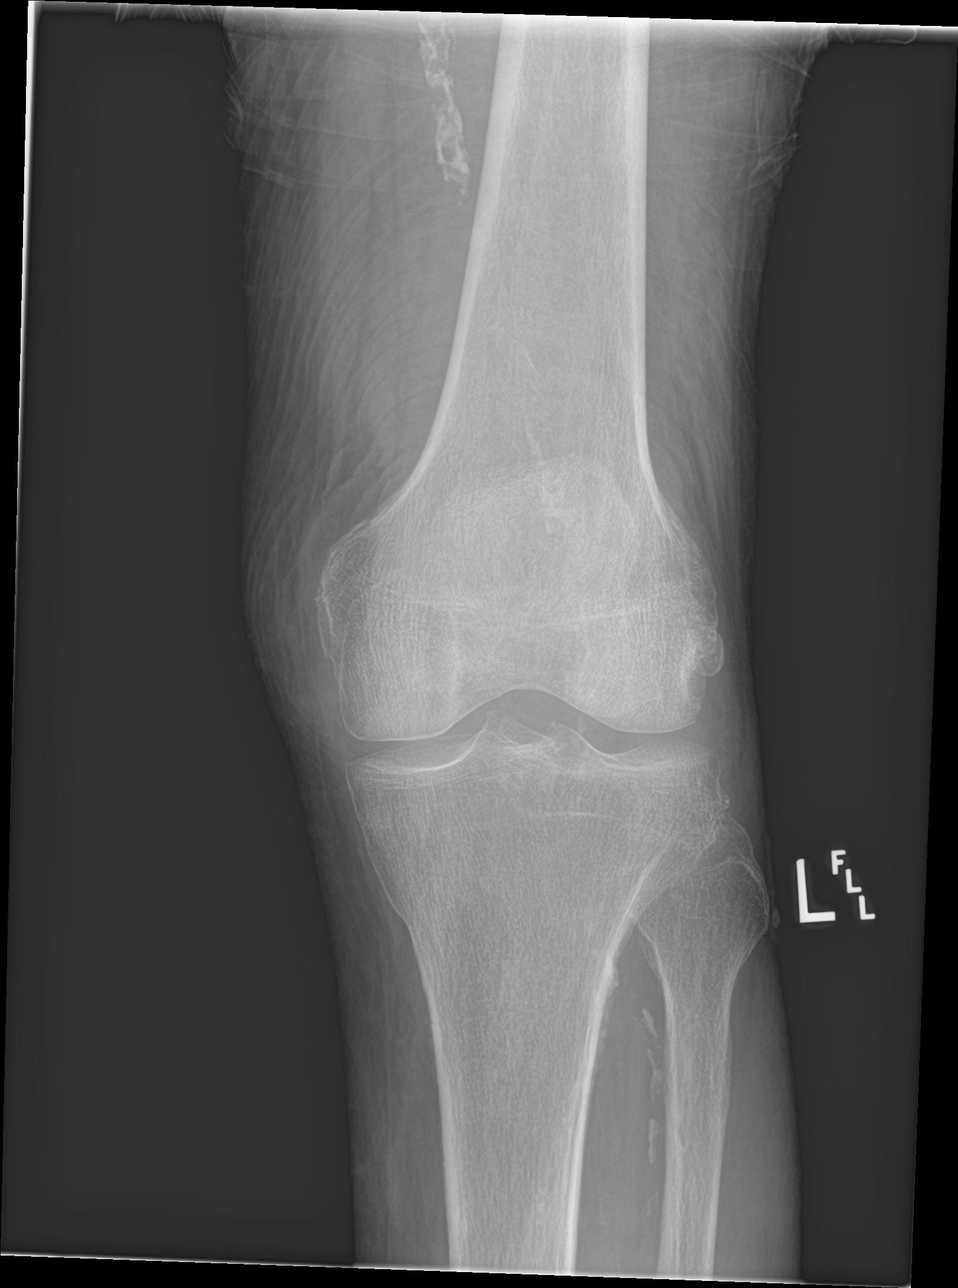

[knee ap (2 of 3)]
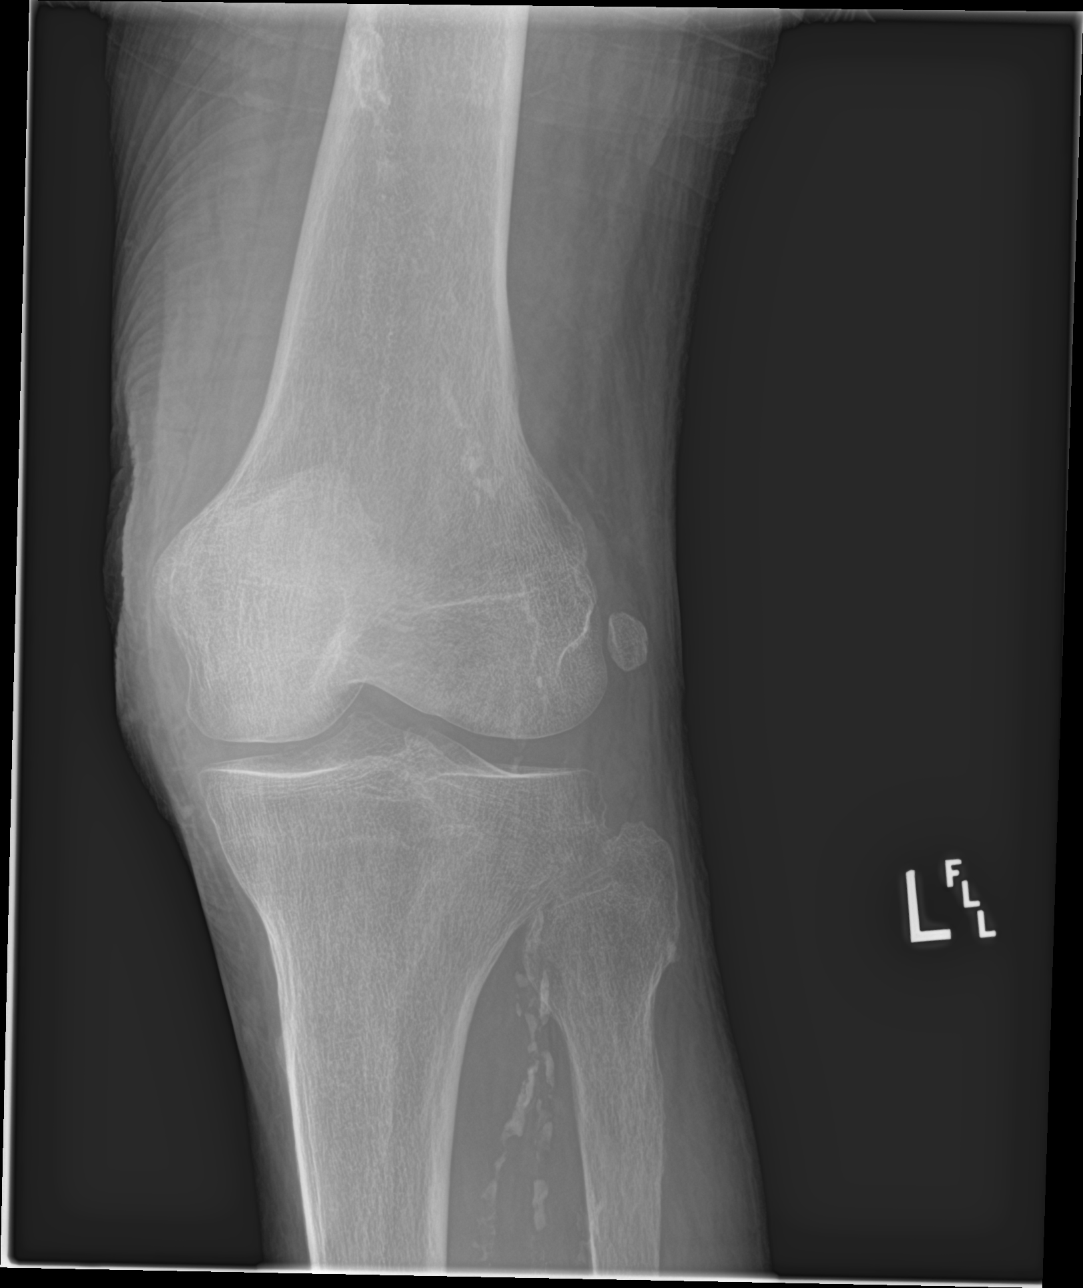

[knee ap (3 of 3)]
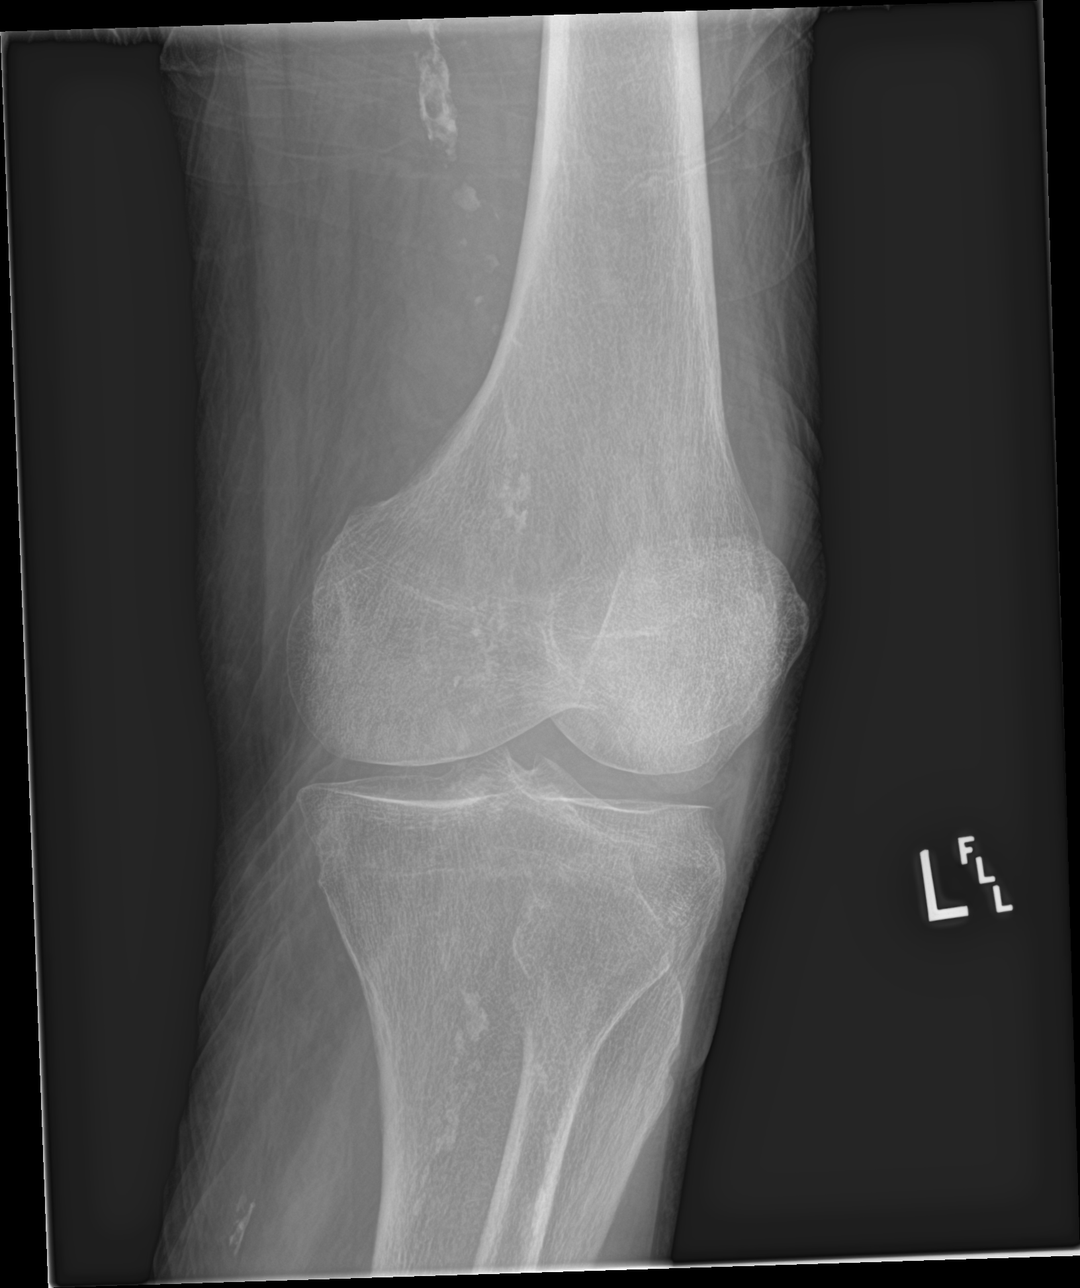

[knee lat]
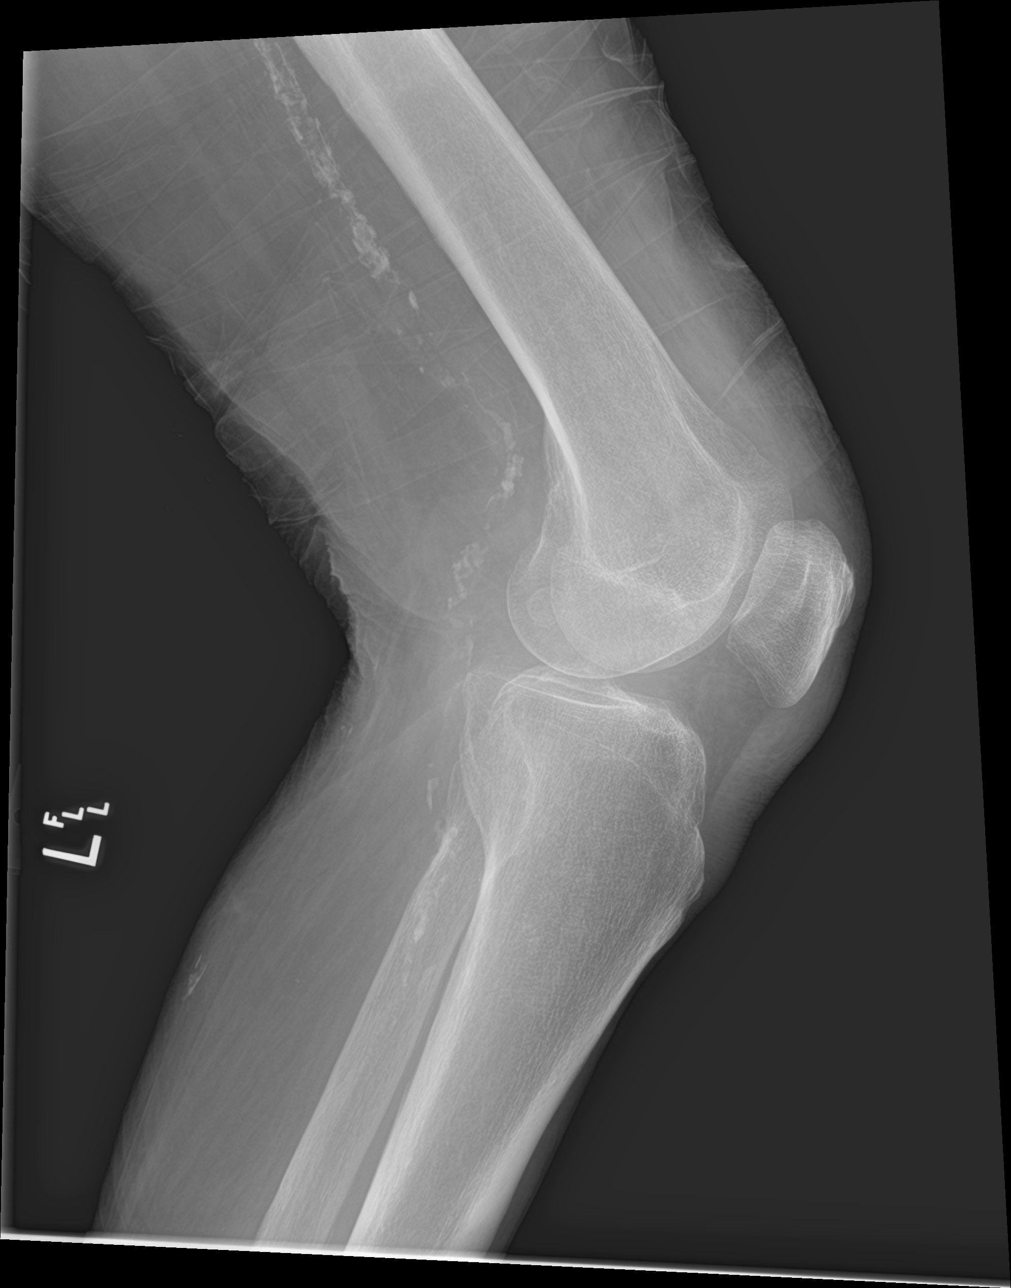

[4 of 4 positions shown; findings below may reference images not displayed]

FINDINGS: For age, the joint spaces are very well preserved with very minimal
degenerative change. No acute fracture is seen. No joint effusion is
noted. Considerable arterial calcification is present involving the
superficial femoral artery and tibioperoneal trunk.
IMPRESSION: 1. No fracture.  No joint effusion.
2. Well preserved joint spaces for age.
3. Arterial calcifications consistent with atherosclerosis.
